# Patient Record
Sex: Male | Born: 1995 | Hispanic: No | Marital: Single | Smoking: Current every day smoker
Health system: Southern US, Community
[De-identification: ages and names within clinical notes are randomized; demographics above are authoritative.]

## PROBLEM LIST (undated history)

## (undated) HISTORY — PX: BACK SURGERY: SHX140

---

## 2015-06-12 ENCOUNTER — Encounter (HOSPITAL_COMMUNITY): Payer: Self-pay | Admitting: Emergency Medicine

## 2015-06-12 ENCOUNTER — Inpatient Hospital Stay (HOSPITAL_COMMUNITY)
Admission: EM | Admit: 2015-06-12 | Discharge: 2015-06-17 | DRG: 460 | Disposition: A | Discharge status: Home / Self Care | Payer: BLUE CROSS/BLUE SHIELD | Attending: General Surgery | Admitting: General Surgery

## 2015-06-12 ENCOUNTER — Emergency Department (HOSPITAL_COMMUNITY): Payer: BLUE CROSS/BLUE SHIELD

## 2015-06-12 DIAGNOSIS — Z419 Encounter for procedure for purposes other than remedying health state, unspecified: Secondary | ICD-10-CM

## 2015-06-12 DIAGNOSIS — S27322A Contusion of lung, bilateral, initial encounter: Secondary | ICD-10-CM | POA: Diagnosis present

## 2015-06-12 DIAGNOSIS — S32001A Stable burst fracture of unspecified lumbar vertebra, initial encounter for closed fracture: Secondary | ICD-10-CM | POA: Diagnosis present

## 2015-06-12 DIAGNOSIS — F17210 Nicotine dependence, cigarettes, uncomplicated: Secondary | ICD-10-CM | POA: Diagnosis present

## 2015-06-12 DIAGNOSIS — J939 Pneumothorax, unspecified: Secondary | ICD-10-CM

## 2015-06-12 DIAGNOSIS — W19XXXA Unspecified fall, initial encounter: Secondary | ICD-10-CM

## 2015-06-12 DIAGNOSIS — S32039A Unspecified fracture of third lumbar vertebra, initial encounter for closed fracture: Secondary | ICD-10-CM

## 2015-06-12 DIAGNOSIS — S32029A Unspecified fracture of second lumbar vertebra, initial encounter for closed fracture: Secondary | ICD-10-CM | POA: Diagnosis present

## 2015-06-12 DIAGNOSIS — G9611 Dural tear: Secondary | ICD-10-CM | POA: Diagnosis present

## 2015-06-12 DIAGNOSIS — S32059A Unspecified fracture of fifth lumbar vertebra, initial encounter for closed fracture: Secondary | ICD-10-CM | POA: Diagnosis present

## 2015-06-12 DIAGNOSIS — S2232XA Fracture of one rib, left side, initial encounter for closed fracture: Secondary | ICD-10-CM | POA: Diagnosis present

## 2015-06-12 DIAGNOSIS — S32031A Stable burst fracture of third lumbar vertebra, initial encounter for closed fracture: Secondary | ICD-10-CM | POA: Diagnosis not present

## 2015-06-12 DIAGNOSIS — S32049A Unspecified fracture of fourth lumbar vertebra, initial encounter for closed fracture: Secondary | ICD-10-CM | POA: Diagnosis present

## 2015-06-12 DIAGNOSIS — W14XXXA Fall from tree, initial encounter: Secondary | ICD-10-CM

## 2015-06-12 DIAGNOSIS — M549 Dorsalgia, unspecified: Secondary | ICD-10-CM | POA: Diagnosis not present

## 2015-06-12 LAB — CBC WITH DIFFERENTIAL/PLATELET
Basophils Absolute: 0 10*3/uL (ref 0.0–0.1)
Basophils Relative: 0 %
EOS ABS: 0.1 10*3/uL (ref 0.0–0.7)
EOS PCT: 1 %
HCT: 45 % (ref 39.0–52.0)
Hemoglobin: 16.3 g/dL (ref 13.0–17.0)
LYMPHS ABS: 2.6 10*3/uL (ref 0.7–4.0)
Lymphocytes Relative: 28 %
MCH: 32.8 pg (ref 26.0–34.0)
MCHC: 36.2 g/dL — AB (ref 30.0–36.0)
MCV: 90.5 fL (ref 78.0–100.0)
MONOS PCT: 12 %
Monocytes Absolute: 1.1 10*3/uL — ABNORMAL HIGH (ref 0.1–1.0)
Neutro Abs: 5.6 10*3/uL (ref 1.7–7.7)
Neutrophils Relative %: 60 %
PLATELETS: 240 10*3/uL (ref 150–400)
RBC: 4.97 MIL/uL (ref 4.22–5.81)
RDW: 13.1 % (ref 11.5–15.5)
WBC: 9.3 10*3/uL (ref 4.0–10.5)

## 2015-06-12 MED ORDER — MORPHINE SULFATE (PF) 4 MG/ML IV SOLN
4.0000 mg | Freq: Once | INTRAVENOUS | Status: AC
  Administered 2015-06-12: 4 mg via INTRAVENOUS
  Filled 2015-06-12: qty 1

## 2015-06-12 MED ORDER — SODIUM CHLORIDE 0.9 % IV BOLUS (SEPSIS)
1000.0000 mL | Freq: Once | INTRAVENOUS | Status: AC
  Administered 2015-06-12: 1000 mL via INTRAVENOUS

## 2015-06-12 NOTE — ED Provider Notes (Signed)
CSN: [id number]     Arrival date & time 06/12/15  2309 History  This chart was scribed for Tomasita Crumble, MD by Freida Busman, ED Scribe. This patient was seen in room B17C/B17C and the patient's care was started 11:12 PM.     Chief Complaint  Patient presents with  . Fall    The history is provided by the patient. No language interpreter was used.     HPI Comments:  Michael Lawrence is a 19 y.o. male who presents to the Emergency Department via EMS complaining of lower back pain following fall from a 10-12 foot tree this evening. He fell flat on his back; no LOC. Pt admits to having ETOH onboard. He arrives to the ED in C-collar and on backboard. No alleviating factors or associated symptoms noted.    History reviewed. No pertinent past medical history. History reviewed. No pertinent past surgical history. No family history on file. Social History  Substance Use Topics  . Smoking status: Current Every Day Smoker  . Smokeless tobacco: None  . Alcohol Use: Yes    Review of Systems  A complete 10 system review of systems was obtained and all systems are negative except as noted in the HPI and PMH.    Allergies  Review of patient's allergies indicates no known allergies.  Home Medications   Prior to Admission medications   Medication Sig Start Date End Date Taking? Authorizing Provider  ibuprofen (ADVIL,MOTRIN) 200 MG tablet Take 200 mg by mouth every 6 (six) hours as needed for moderate pain.   Yes Historical Provider, MD   BP 138/63 mmHg  Pulse 78  Temp(Src) 97.9 F (36.6 C) (Oral)  Resp 23  SpO2 96% Physical Exam  Constitutional: He is oriented to person, place, and time. Vital signs are normal. He appears well-developed and well-nourished.  Non-toxic appearance. He does not appear ill. No distress.  Clincally intoxicated  HENT:  Head: Normocephalic and atraumatic.  Nose: Nose normal.  Mouth/Throat: Oropharynx is clear and moist. No oropharyngeal exudate.  Eyes:  Conjunctivae and EOM are normal. Pupils are equal, round, and reactive to light. No scleral icterus.  Neck: No edema, no erythema and normal range of motion present. No thyroid mass present.  C-collar in place   Cardiovascular: Normal rate, regular rhythm, S1 normal, S2 normal, normal heart sounds, intact distal pulses and normal pulses.  Exam reveals no gallop and no friction rub.   No murmur heard. Pulses:      Radial pulses are 2+ on the right side, and 2+ on the left side.       Dorsalis pedis pulses are 2+ on the right side, and 2+ on the left side.  Pulmonary/Chest: Effort normal and breath sounds normal. No respiratory distress. He has no wheezes. He has no rhonchi. He has no rales.  Abdominal: Soft. Normal appearance and bowel sounds are normal. He exhibits no distension, no ascites and no mass. There is no hepatosplenomegaly. There is no tenderness. There is no rebound, no guarding and no CVA tenderness.  Musculoskeletal: Normal range of motion. He exhibits no edema or tenderness.  Neurological: He is alert and oriented to person, place, and time. He has normal strength. No cranial nerve deficit or sensory deficit.  Normal strength and sensation in all extremities  Follows all commands  Skin: Skin is warm, dry and intact. No petechiae and no rash noted. He is not diaphoretic. No erythema. No pallor.  Psychiatric: He has a normal mood and affect.  His behavior is normal. Judgment normal.  Nursing note and vitals reviewed.   ED Course  Procedures   DIAGNOSTIC STUDIES:  Oxygen Saturation is 96% on RA, normal by my interpretation.    COORDINATION OF CARE:  11:15 PM Will order imaging studies. Discussed treatment plan with pt at bedside and pt agreed to plan.  Labs Review Labs Reviewed  CBC WITH DIFFERENTIAL/PLATELET - Abnormal; Notable for the following:    MCHC 36.2 (*)    Monocytes Absolute 1.1 (*)    All other components within normal limits  COMPREHENSIVE METABOLIC PANEL  - Abnormal; Notable for the following:    Potassium 3.1 (*)    Chloride 100 (*)    Glucose, Bld 109 (*)    Total Bilirubin 1.5 (*)    All other components within normal limits  URINALYSIS, ROUTINE W REFLEX MICROSCOPIC (NOT AT [facility]) - Abnormal; Notable for the following:    Specific Gravity, Urine >1.046 (*)    Hgb urine dipstick TRACE (*)    Ketones, ur 15 (*)    Protein, ur 30 (*)    Leukocytes, UA SMALL (*)    All other components within normal limits  I-STAT CG4 LACTIC ACID, ED - Abnormal; Notable for the following:    Lactic Acid, Venous 2.22 (*)    All other components within normal limits  URINE MICROSCOPIC-ADD ON    Imaging Review Ct Head Wo Contrast  06/13/2015   CLINICAL DATA:  Low back pain after falling from 10-12 foot tree. No loss of consciousness. Alcohol on board.  EXAM: CT HEAD WITHOUT CONTRAST  CT CERVICAL SPINE WITHOUT CONTRAST  TECHNIQUE: Multidetector CT imaging of the head and cervical spine was performed following the standard protocol without intravenous contrast. Multiplanar CT image reconstructions of the cervical spine were also generated.  COMPARISON:  None.  FINDINGS: CT HEAD FINDINGS  Ventricles and sulci are symmetrical. No mass effect or midline shift. No abnormal extra-axial fluid collections. Gray-white matter junctions are distinct. Basal cisterns are not effaced. No evidence of acute intracranial hemorrhage. No depressed skull fractures. Visualized paranasal sinuses and mastoid air cells are not opacified.  CT CERVICAL SPINE FINDINGS  Normal alignment of the cervical spine. No vertebral compression deformities. Intervertebral disc space heights are preserved. No prevertebral soft tissue swelling. No focal bone lesion or bone destruction. C1-2 articulation appears intact. Bone cortex and trabecular architecture appear intact.  IMPRESSION: No acute intracranial abnormalities. Normal alignment of the cervical spine. No acute displaced fractures identified.    Electronically Signed   By: Burman Nieves M.D.   On: 06/13/2015 02:00   Ct Chest W Contrast  06/13/2015   CLINICAL DATA:  Low back pain after falling from a 10-12 foot tree this evening. No loss of consciousness. Alcohol on board.  EXAM: CT CHEST, ABDOMEN, AND PELVIS WITH CONTRAST  TECHNIQUE: Multidetector CT imaging of the chest, abdomen and pelvis was performed following the standard protocol during bolus administration of intravenous contrast.  CONTRAST:  OMNIPAQUE IOHEXOL 300 MG/ML  SOLN  COMPARISON:  None.  FINDINGS: CT CHEST FINDINGS  Normal heart size. Normal caliber thoracic aorta. No evidence of aortic aneurysm, allowing for significant motion artifact. Great vessel origins are patent. Esophagus is decompressed. No abnormal mediastinal gas or fluid collections. No significant lymphadenopathy in the chest.  Posterior infiltrates in the lungs likely representing pulmonary contusion. There is a tiny left anterior basilar pneumothorax. No pulmonary collapse or tension. Airways appear patent. No pleural effusions.  CT ABDOMEN PELVIS  FINDINGS  Small hyper enhancing focus in segment 5 of the liver measuring 1.3 cm diameter. This is likely represent a small focal nodular hyperplasia or perhaps a small transient hepatic attenuation defect. No focal liver lesions that would suggest laceration. The gallbladder, spleen, pancreas, adrenal glands, kidneys, abdominal aorta, inferior vena cava, and retroperitoneal lymph nodes are unremarkable. Stomach is filled with ingested fluid. No gastric distention. No small or large bowel distention. Small amount of gas in the left upper quadrant anterior abdominal wall. No free intra-abdominal air is identified. No free abdominal fluid. Abdominal wall musculature appears intact.  Pelvis: Prostate gland is not enlarged. Bladder wall is not thickened. No free or loculated pelvic fluid collections. No pelvic mass or lymphadenopathy. Appendix is not identified. No  diverticulitis.  Bones: Comminuted fracture of the L3 vertebra with retropulsion of a posterior fracture fragment into the central canal resulting in severe narrowing of the central canal anteriorly. At the narrowest point, the central canal measures only about 5 mm AP dimension. Displaced fracture fragments also cause compromise of the neural foramina bilaterally at L3-4. Fractures of the left transverse process of L2, L3, L4, and L5. Sacrum, pelvis, and hips appear intact. Normal alignment of the thoracic spine. No thoracic vertebral compression fractures identified. Visualized shoulders and clavicles appear intact. Fracture of the left posterior ninth rib without displacement. Sternum is intact.  IMPRESSION: Burst compression fracture of the L3 vertebra with retropulsion of posterior fracture fragments causing severe narrowing of the central canal and bilateral neural foramina. Fractures of the left transverse process of L2, L3, L4, and L5. Nondisplaced fracture of the left posterior ninth rib. Tiny left anterior pneumothorax without collapse or tension. Subcutaneous emphysema in the left anterior chest/abdominal wall. Bilateral posterior pulmonary contusions. No evidence of solid organ injury or bowel perforation. Small nonspecific hyper enhancing focus in segment 5 of the liver probably benign.  These results were called by telephone at the time of interpretation on 06/13/2015 at 2:11 am to Dr. Tomasita Crumble , who verbally acknowledged these results.   Electronically Signed   By: Burman Nieves M.D.   On: 06/13/2015 02:14   Ct Cervical Spine Wo Contrast  06/13/2015   CLINICAL DATA:  Low back pain after falling from 10-12 foot tree. No loss of consciousness. Alcohol on board.  EXAM: CT HEAD WITHOUT CONTRAST  CT CERVICAL SPINE WITHOUT CONTRAST  TECHNIQUE: Multidetector CT imaging of the head and cervical spine was performed following the standard protocol without intravenous contrast. Multiplanar CT image  reconstructions of the cervical spine were also generated.  COMPARISON:  None.  FINDINGS: CT HEAD FINDINGS  Ventricles and sulci are symmetrical. No mass effect or midline shift. No abnormal extra-axial fluid collections. Gray-white matter junctions are distinct. Basal cisterns are not effaced. No evidence of acute intracranial hemorrhage. No depressed skull fractures. Visualized paranasal sinuses and mastoid air cells are not opacified.  CT CERVICAL SPINE FINDINGS  Normal alignment of the cervical spine. No vertebral compression deformities. Intervertebral disc space heights are preserved. No prevertebral soft tissue swelling. No focal bone lesion or bone destruction. C1-2 articulation appears intact. Bone cortex and trabecular architecture appear intact.  IMPRESSION: No acute intracranial abnormalities. Normal alignment of the cervical spine. No acute displaced fractures identified.   Electronically Signed   By: Burman Nieves M.D.   On: 06/13/2015 02:00   Ct Abdomen Pelvis W Contrast  06/13/2015   CLINICAL DATA:  Low back pain after falling from a 10-12 foot tree this evening.  No loss of consciousness. Alcohol on board.  EXAM: CT CHEST, ABDOMEN, AND PELVIS WITH CONTRAST  TECHNIQUE: Multidetector CT imaging of the chest, abdomen and pelvis was performed following the standard protocol during bolus administration of intravenous contrast.  CONTRAST:  OMNIPAQUE IOHEXOL 300 MG/ML  SOLN  COMPARISON:  None.  FINDINGS: CT CHEST FINDINGS  Normal heart size. Normal caliber thoracic aorta. No evidence of aortic aneurysm, allowing for significant motion artifact. Great vessel origins are patent. Esophagus is decompressed. No abnormal mediastinal gas or fluid collections. No significant lymphadenopathy in the chest.  Posterior infiltrates in the lungs likely representing pulmonary contusion. There is a tiny left anterior basilar pneumothorax. No pulmonary collapse or tension. Airways appear patent. No pleural  effusions.  CT ABDOMEN PELVIS FINDINGS  Small hyper enhancing focus in segment 5 of the liver measuring 1.3 cm diameter. This is likely represent a small focal nodular hyperplasia or perhaps a small transient hepatic attenuation defect. No focal liver lesions that would suggest laceration. The gallbladder, spleen, pancreas, adrenal glands, kidneys, abdominal aorta, inferior vena cava, and retroperitoneal lymph nodes are unremarkable. Stomach is filled with ingested fluid. No gastric distention. No small or large bowel distention. Small amount of gas in the left upper quadrant anterior abdominal wall. No free intra-abdominal air is identified. No free abdominal fluid. Abdominal wall musculature appears intact.  Pelvis: Prostate gland is not enlarged. Bladder wall is not thickened. No free or loculated pelvic fluid collections. No pelvic mass or lymphadenopathy. Appendix is not identified. No diverticulitis.  Bones: Comminuted fracture of the L3 vertebra with retropulsion of a posterior fracture fragment into the central canal resulting in severe narrowing of the central canal anteriorly. At the narrowest point, the central canal measures only about 5 mm AP dimension. Displaced fracture fragments also cause compromise of the neural foramina bilaterally at L3-4. Fractures of the left transverse process of L2, L3, L4, and L5. Sacrum, pelvis, and hips appear intact. Normal alignment of the thoracic spine. No thoracic vertebral compression fractures identified. Visualized shoulders and clavicles appear intact. Fracture of the left posterior ninth rib without displacement. Sternum is intact.  IMPRESSION: Burst compression fracture of the L3 vertebra with retropulsion of posterior fracture fragments causing severe narrowing of the central canal and bilateral neural foramina. Fractures of the left transverse process of L2, L3, L4, and L5. Nondisplaced fracture of the left posterior ninth rib. Tiny left anterior pneumothorax  without collapse or tension. Subcutaneous emphysema in the left anterior chest/abdominal wall. Bilateral posterior pulmonary contusions. No evidence of solid organ injury or bowel perforation. Small nonspecific hyper enhancing focus in segment 5 of the liver probably benign.  These results were called by telephone at the time of interpretation on 06/13/2015 at 2:11 am to Dr. Tomasita Crumble , who verbally acknowledged these results.   Electronically Signed   By: Burman Nieves M.D.   On: 06/13/2015 02:14   Dg Pelvis Portable  06/13/2015   CLINICAL DATA:  Back pain after fall from tree at 10-12 feet.  EXAM: PORTABLE PELVIS 1-2 VIEWS  COMPARISON:  None.  FINDINGS: Mild widening of the symphysis pubis. Can't exclude pubic diastases versus normal variation. Internal pelvic line appears intact. SI joints are not displaced. No acute fractures demonstrated in the pelvis or visualized hips.  IMPRESSION: Mild widening of the symphysis pubis. Can't exclude mild pubic diastases although this could be normal variation. No acute fractures demonstrated.   Electronically Signed   By: Burman Nieves M.D.   On:  06/13/2015 00:04   Ct T-spine No Charge  06/13/2015   CLINICAL DATA:  Low back pain after a fall from 10-12 foot tree this evening. No loss of consciousness. Alcohol.  EXAM: CT THORACIC SPINE WITHOUT CONTRAST  TECHNIQUE: Multidetector CT imaging of the thoracic spine was performed without intravenous contrast administration. Multiplanar CT image reconstructions were also generated.  COMPARISON:  None.  FINDINGS: Normal alignment of the thoracic spine. No vertebral compression deformities. Intervertebral disc space heights are preserved. No focal bone lesion or bone destruction. Bone cortex and trabecular architecture appears intact. Incidental note of a nondisplaced fracture of the left posterior ninth rib. No paraspinal soft tissue swelling or infiltration.  IMPRESSION: Normal alignment of the thoracic spine. No acute  displaced fractures identified. Nondisplaced fracture of the left posterior ninth rib.   Electronically Signed   By: Burman Nieves M.D.   On: 06/13/2015 01:56   Ct L-spine No Charge  06/13/2015   CLINICAL DATA:  Low back pain after a fall 10-12 feet from a tree. Alcohol on board.  EXAM: CT LUMBAR SPINE WITHOUT CONTRAST  TECHNIQUE: Multidetector CT imaging of the lumbar spine was performed without intravenous contrast administration. Multiplanar CT image reconstructions were also generated.  COMPARISON:  None.  FINDINGS: There is a burst compression fracture of the L3 vertebra with about 20% loss of height diffusely. There is retropulsion of comminuted fracture fragments causing severe narrowing of the central canal, which measures only about 5 mm in AP dimension. Fracture fragments extend to cause significant effacement of the left lateral recess and of the neural foramina bilaterally. No anterior subluxation. Remaining lumbar vertebrae demonstrate normal alignment. No additional compression deformities are demonstrated. Fractures of the left transverse process of L2, L3, L4, and L5. Visualize sacrum appears intact.  IMPRESSION: Burst compression fracture the L3 vertebra with retropulsion of fracture fragments causing significant effacement of the central canal and bilateral neural foramina. Fractures of the left transverse process of L2, L3, L4, and L5.   Electronically Signed   By: Burman Nieves M.D.   On: 06/13/2015 02:19   I have personally reviewed and evaluated these images and lab results as part of my medical decision-making.   EKG Interpretation None      MDM   Final diagnoses:  Fall    Patient presents emergency department after heavy drinking and climbing a tree. He fell out of a tree 12 feet onto the ground. He is currently complaining of lower back pain.  He denies any neurological symptoms. Will obtain CT scan from head to abdomen including spine due to mechanism of injury. He  was given morphine for pain control. IVF ordered as well.  I spoke with Dr. Wynetta Emery with neurosurgery who recommends for the patient to be admitted for his L3 injury. Because he has multiple systems injured, including left posterior ninth rib fracture, small left pneumothorax, pulmonary contusions, patient will be admitted to, surgery. I have paged them for admission.  I personally performed the services described in this documentation, which was scribed in my presence. The recorded information has been reviewed and is accurate.   Tomasita Crumble, MD 06/13/15 [phone]

## 2015-06-12 NOTE — ED Notes (Signed)
Per ems-- pt has been drinking tonight and decided to climb in a tree. Pt fell from tree at 10-12 feet. Pt landed on back. No loc. Pt on lsb and c-collar in place. Pt alert and oriented x4.

## 2015-06-13 ENCOUNTER — Inpatient Hospital Stay (HOSPITAL_COMMUNITY): Payer: BLUE CROSS/BLUE SHIELD

## 2015-06-13 ENCOUNTER — Inpatient Hospital Stay (HOSPITAL_COMMUNITY): Payer: BLUE CROSS/BLUE SHIELD | Admitting: Anesthesiology

## 2015-06-13 ENCOUNTER — Encounter (HOSPITAL_COMMUNITY): Payer: Self-pay | Admitting: Radiology

## 2015-06-13 ENCOUNTER — Emergency Department (HOSPITAL_COMMUNITY): Payer: BLUE CROSS/BLUE SHIELD

## 2015-06-13 ENCOUNTER — Encounter (HOSPITAL_COMMUNITY): Admission: EM | Disposition: A | Discharge status: Home / Self Care | Payer: Self-pay | Source: Home / Self Care

## 2015-06-13 DIAGNOSIS — S32029A Unspecified fracture of second lumbar vertebra, initial encounter for closed fracture: Secondary | ICD-10-CM | POA: Diagnosis present

## 2015-06-13 DIAGNOSIS — G9611 Dural tear: Secondary | ICD-10-CM | POA: Diagnosis present

## 2015-06-13 DIAGNOSIS — S32031A Stable burst fracture of third lumbar vertebra, initial encounter for closed fracture: Secondary | ICD-10-CM | POA: Diagnosis present

## 2015-06-13 DIAGNOSIS — F17210 Nicotine dependence, cigarettes, uncomplicated: Secondary | ICD-10-CM | POA: Diagnosis present

## 2015-06-13 DIAGNOSIS — W14XXXA Fall from tree, initial encounter: Secondary | ICD-10-CM | POA: Diagnosis not present

## 2015-06-13 DIAGNOSIS — M549 Dorsalgia, unspecified: Secondary | ICD-10-CM | POA: Diagnosis present

## 2015-06-13 DIAGNOSIS — S32001A Stable burst fracture of unspecified lumbar vertebra, initial encounter for closed fracture: Secondary | ICD-10-CM | POA: Diagnosis present

## 2015-06-13 DIAGNOSIS — S27322A Contusion of lung, bilateral, initial encounter: Secondary | ICD-10-CM | POA: Diagnosis present

## 2015-06-13 DIAGNOSIS — S32059A Unspecified fracture of fifth lumbar vertebra, initial encounter for closed fracture: Secondary | ICD-10-CM | POA: Diagnosis present

## 2015-06-13 DIAGNOSIS — S2232XA Fracture of one rib, left side, initial encounter for closed fracture: Secondary | ICD-10-CM | POA: Diagnosis present

## 2015-06-13 DIAGNOSIS — S32049A Unspecified fracture of fourth lumbar vertebra, initial encounter for closed fracture: Secondary | ICD-10-CM | POA: Diagnosis present

## 2015-06-13 HISTORY — PX: POSTERIOR LUMBAR FUSION: SHX6036

## 2015-06-13 LAB — BASIC METABOLIC PANEL
Anion gap: 10 (ref 5–15)
BUN: 9 mg/dL (ref 6–20)
CALCIUM: 9.2 mg/dL (ref 8.9–10.3)
CHLORIDE: 103 mmol/L (ref 101–111)
CO2: 24 mmol/L (ref 22–32)
CREATININE: 0.89 mg/dL (ref 0.61–1.24)
GFR calc non Af Amer: >60 mL/min (ref >60)
GLUCOSE: 113 mg/dL — AB (ref 65–99)
Potassium: 3.9 mmol/L (ref 3.5–5.1)
Sodium: 137 mmol/L (ref 135–145)

## 2015-06-13 LAB — I-STAT CG4 LACTIC ACID, ED: LACTIC ACID, VENOUS: 2.22 mmol/L — AB (ref 0.5–2.0)

## 2015-06-13 LAB — CBC
HEMATOCRIT: 44.9 % (ref 39.0–52.0)
HEMOGLOBIN: 15.7 g/dL (ref 13.0–17.0)
MCH: 31.7 pg (ref 26.0–34.0)
MCHC: 35 g/dL (ref 30.0–36.0)
MCV: 90.5 fL (ref 78.0–100.0)
Platelets: 236 10*3/uL (ref 150–400)
RBC: 4.96 MIL/uL (ref 4.22–5.81)
RDW: 13.1 % (ref 11.5–15.5)
WBC: 15.6 10*3/uL — ABNORMAL HIGH (ref 4.0–10.5)

## 2015-06-13 LAB — URINALYSIS, ROUTINE W REFLEX MICROSCOPIC
Bilirubin Urine: NEGATIVE
GLUCOSE, UA: NEGATIVE mg/dL
Ketones, ur: 15 mg/dL — AB
Nitrite: NEGATIVE
PROTEIN: 30 mg/dL — AB
UROBILINOGEN UA: 0.2 mg/dL (ref 0.0–1.0)
pH: 6 (ref 5.0–8.0)

## 2015-06-13 LAB — COMPREHENSIVE METABOLIC PANEL
ALT: 25 U/L (ref 17–63)
AST: 37 U/L (ref 15–41)
Albumin: 4.2 g/dL (ref 3.5–5.0)
Alkaline Phosphatase: 76 U/L (ref 38–126)
Anion gap: 12 (ref 5–15)
BUN: 10 mg/dL (ref 6–20)
CHLORIDE: 100 mmol/L — AB (ref 101–111)
CO2: 25 mmol/L (ref 22–32)
CREATININE: 1.03 mg/dL (ref 0.61–1.24)
Calcium: 9.4 mg/dL (ref 8.9–10.3)
GFR calc Af Amer: >60 mL/min (ref >60)
GFR calc non Af Amer: >60 mL/min (ref >60)
Glucose, Bld: 109 mg/dL — ABNORMAL HIGH (ref 65–99)
POTASSIUM: 3.1 mmol/L — AB (ref 3.5–5.1)
SODIUM: 137 mmol/L (ref 135–145)
Total Bilirubin: 1.5 mg/dL — ABNORMAL HIGH (ref 0.3–1.2)
Total Protein: 7.2 g/dL (ref 6.5–8.1)

## 2015-06-13 LAB — MRSA PCR SCREENING: MRSA by PCR: NEGATIVE

## 2015-06-13 LAB — URINE MICROSCOPIC-ADD ON

## 2015-06-13 LAB — TYPE AND SCREEN
ABO/RH(D): O POS
ANTIBODY SCREEN: NEGATIVE

## 2015-06-13 LAB — ABO/RH: ABO/RH(D): O POS

## 2015-06-13 SURGERY — POSTERIOR LUMBAR FUSION
Anesthesia: General | Site: Spine Lumbar

## 2015-06-13 MED ORDER — 0.9 % SODIUM CHLORIDE (POUR BTL) OPTIME
TOPICAL | Status: DC | PRN
  Administered 2015-06-13: 1000 mL

## 2015-06-13 MED ORDER — SUCCINYLCHOLINE CHLORIDE 20 MG/ML IJ SOLN
INTRAMUSCULAR | Status: AC
  Filled 2015-06-13: qty 1

## 2015-06-13 MED ORDER — THROMBIN 20000 UNITS EX SOLR
CUTANEOUS | Status: DC | PRN
  Administered 2015-06-13: 12:00:00 via TOPICAL

## 2015-06-13 MED ORDER — MIDAZOLAM HCL 2 MG/2ML IJ SOLN
INTRAMUSCULAR | Status: AC
  Filled 2015-06-13: qty 4

## 2015-06-13 MED ORDER — CEFAZOLIN SODIUM-DEXTROSE 2-3 GM-% IV SOLR
INTRAVENOUS | Status: DC | PRN
  Administered 2015-06-13: 2 g via INTRAVENOUS

## 2015-06-13 MED ORDER — OXYCODONE-ACETAMINOPHEN 5-325 MG PO TABS
1.0000 | ORAL_TABLET | ORAL | Status: DC | PRN
  Administered 2015-06-14 (×2): 2 via ORAL
  Filled 2015-06-13 (×2): qty 2

## 2015-06-13 MED ORDER — CEFAZOLIN SODIUM-DEXTROSE 2-3 GM-% IV SOLR: INTRAVENOUS | Status: DC | PRN

## 2015-06-13 MED ORDER — VANCOMYCIN HCL 1000 MG IV SOLR
INTRAVENOUS | Status: AC
  Filled 2015-06-13: qty 1000

## 2015-06-13 MED ORDER — VANCOMYCIN HCL 1000 MG IV SOLR
INTRAVENOUS | Status: DC | PRN
  Administered 2015-06-13: 1000 mg via TOPICAL

## 2015-06-13 MED ORDER — LACTATED RINGERS IV SOLN
INTRAVENOUS | Status: DC | PRN
  Administered 2015-06-13 (×3): via INTRAVENOUS

## 2015-06-13 MED ORDER — CEFAZOLIN SODIUM-DEXTROSE 2-3 GM-% IV SOLR
2.0000 g | Freq: Three times a day (TID) | INTRAVENOUS | Status: AC
  Administered 2015-06-13 – 2015-06-14 (×2): 2 g via INTRAVENOUS
  Filled 2015-06-13 (×2): qty 50

## 2015-06-13 MED ORDER — CYCLOBENZAPRINE HCL 10 MG PO TABS
10.0000 mg | ORAL_TABLET | Freq: Three times a day (TID) | ORAL | Status: DC | PRN
Start: 2015-06-13 — End: 2015-06-17
  Administered 2015-06-13 – 2015-06-16 (×4): 10 mg via ORAL
  Filled 2015-06-13 (×4): qty 1

## 2015-06-13 MED ORDER — ACETAMINOPHEN 325 MG PO TABS: 650.0000 mg | ORAL_TABLET | ORAL | Status: DC | PRN

## 2015-06-13 MED ORDER — GLYCOPYRROLATE 0.2 MG/ML IJ SOLN
INTRAMUSCULAR | Status: DC | PRN
  Administered 2015-06-13: .8 mg via INTRAVENOUS

## 2015-06-13 MED ORDER — PROPOFOL 10 MG/ML IV BOLUS
INTRAVENOUS | Status: DC | PRN
  Administered 2015-06-13: 180 mg via INTRAVENOUS

## 2015-06-13 MED ORDER — FENTANYL CITRATE (PF) 100 MCG/2ML IJ SOLN
INTRAMUSCULAR | Status: DC | PRN
  Administered 2015-06-13: 25 ug via INTRAVENOUS
  Administered 2015-06-13: 100 ug via INTRAVENOUS
  Administered 2015-06-13 (×4): 50 ug via INTRAVENOUS
  Administered 2015-06-13: 150 ug via INTRAVENOUS
  Administered 2015-06-13: 25 ug via INTRAVENOUS

## 2015-06-13 MED ORDER — ALBUMIN HUMAN 5 % IV SOLN
INTRAVENOUS | Status: DC | PRN
  Administered 2015-06-13: 14:00:00 via INTRAVENOUS

## 2015-06-13 MED ORDER — SODIUM CHLORIDE 0.9 % IJ SOLN
3.0000 mL | Freq: Two times a day (BID) | INTRAMUSCULAR | Status: DC
  Administered 2015-06-14 – 2015-06-17 (×7): 3 mL via INTRAVENOUS

## 2015-06-13 MED ORDER — SODIUM CHLORIDE 0.9 % IR SOLN
Status: DC | PRN
  Administered 2015-06-13: 12:00:00

## 2015-06-13 MED ORDER — SODIUM CHLORIDE 0.9 % IV SOLN: 250.0000 mL | INTRAVENOUS | Status: DC

## 2015-06-13 MED ORDER — PHENOL 1.4 % MT LIQD
1.0000 | OROMUCOSAL | Status: DC | PRN
Start: 2015-06-13 — End: 2015-06-17

## 2015-06-13 MED ORDER — SUCCINYLCHOLINE CHLORIDE 20 MG/ML IJ SOLN
INTRAMUSCULAR | Status: DC | PRN
  Administered 2015-06-13: 120 mg via INTRAVENOUS

## 2015-06-13 MED ORDER — LIDOCAINE HCL (CARDIAC) 20 MG/ML IV SOLN
INTRAVENOUS | Status: AC
  Filled 2015-06-13: qty 5

## 2015-06-13 MED ORDER — MENTHOL 3 MG MT LOZG
1.0000 | LOZENGE | OROMUCOSAL | Status: DC | PRN
  Filled 2015-06-13: qty 9

## 2015-06-13 MED ORDER — HYDROMORPHONE HCL 1 MG/ML IJ SOLN
0.2500 mg | INTRAMUSCULAR | Status: DC | PRN
Start: 2015-06-13 — End: 2015-06-13
  Administered 2015-06-13 (×2): 0.5 mg via INTRAVENOUS

## 2015-06-13 MED ORDER — ONDANSETRON HCL 4 MG/2ML IJ SOLN: 4.0000 mg | Freq: Four times a day (QID) | INTRAMUSCULAR | Status: DC | PRN

## 2015-06-13 MED ORDER — LIDOCAINE-EPINEPHRINE 1 %-1:100000 IJ SOLN
INTRAMUSCULAR | Status: DC | PRN
  Administered 2015-06-13: 9 mL

## 2015-06-13 MED ORDER — HYDROMORPHONE HCL 1 MG/ML IJ SOLN
1.0000 mg | Freq: Once | INTRAMUSCULAR | Status: AC
  Administered 2015-06-13: 1 mg via INTRAVENOUS
  Filled 2015-06-13: qty 1

## 2015-06-13 MED ORDER — ONDANSETRON HCL 4 MG PO TABS: 4.0000 mg | ORAL_TABLET | Freq: Four times a day (QID) | ORAL | Status: DC | PRN

## 2015-06-13 MED ORDER — HYDROMORPHONE HCL 1 MG/ML IJ SOLN
1.0000 mg | INTRAMUSCULAR | Status: DC | PRN
  Administered 2015-06-13 – 2015-06-15 (×21): 1 mg via INTRAVENOUS
  Filled 2015-06-13 (×21): qty 1

## 2015-06-13 MED ORDER — BUPIVACAINE LIPOSOME 1.3 % IJ SUSP
20.0000 mL | Freq: Once | INTRAMUSCULAR | Status: DC
  Filled 2015-06-13: qty 20

## 2015-06-13 MED ORDER — PROPOFOL 10 MG/ML IV BOLUS
INTRAVENOUS | Status: AC
Start: 2015-06-13 — End: 2015-06-13
  Filled 2015-06-13: qty 20

## 2015-06-13 MED ORDER — ONDANSETRON HCL 4 MG/2ML IJ SOLN
4.0000 mg | INTRAMUSCULAR | Status: DC | PRN
  Administered 2015-06-16: 4 mg via INTRAVENOUS
  Filled 2015-06-13: qty 2

## 2015-06-13 MED ORDER — PROPOFOL 10 MG/ML IV BOLUS
INTRAVENOUS | Status: AC
  Filled 2015-06-13: qty 20

## 2015-06-13 MED ORDER — PHENYLEPHRINE HCL 10 MG/ML IJ SOLN
INTRAMUSCULAR | Status: DC | PRN
  Administered 2015-06-13: 40 ug via INTRAVENOUS
  Administered 2015-06-13 (×2): 80 ug via INTRAVENOUS
  Administered 2015-06-13: 40 ug via INTRAVENOUS

## 2015-06-13 MED ORDER — ACETAMINOPHEN 650 MG RE SUPP: 650.0000 mg | RECTAL | Status: DC | PRN

## 2015-06-13 MED ORDER — ROCURONIUM BROMIDE 50 MG/5ML IV SOLN
INTRAVENOUS | Status: AC
  Filled 2015-06-13: qty 1

## 2015-06-13 MED ORDER — NEOSTIGMINE METHYLSULFATE 10 MG/10ML IV SOLN
INTRAVENOUS | Status: DC | PRN
  Administered 2015-06-13: 5 mg via INTRAVENOUS

## 2015-06-13 MED ORDER — FENTANYL CITRATE (PF) 250 MCG/5ML IJ SOLN
INTRAMUSCULAR | Status: AC
  Filled 2015-06-13: qty 5

## 2015-06-13 MED ORDER — ONDANSETRON HCL 4 MG/2ML IJ SOLN
4.0000 mg | Freq: Four times a day (QID) | INTRAMUSCULAR | Status: DC | PRN
  Administered 2015-06-13: 4 mg via INTRAVENOUS

## 2015-06-13 MED ORDER — PHENYLEPHRINE 40 MCG/ML (10ML) SYRINGE FOR IV PUSH (FOR BLOOD PRESSURE SUPPORT)
PREFILLED_SYRINGE | INTRAVENOUS | Status: AC
  Filled 2015-06-13: qty 10

## 2015-06-13 MED ORDER — DEXTROSE-NACL 5-0.9 % IV SOLN
INTRAVENOUS | Status: DC
  Administered 2015-06-13: 16:00:00 via INTRAVENOUS
  Administered 2015-06-13: 1000 mL via INTRAVENOUS
  Administered 2015-06-14: via INTRAVENOUS
  Administered 2015-06-15: 50 mL/h via INTRAVENOUS

## 2015-06-13 MED ORDER — OXYCODONE HCL 5 MG PO TABS: 5.0000 mg | ORAL_TABLET | Freq: Once | ORAL | Status: DC | PRN

## 2015-06-13 MED ORDER — LIDOCAINE HCL (CARDIAC) 20 MG/ML IV SOLN
INTRAVENOUS | Status: DC | PRN
  Administered 2015-06-13: 80 mg via INTRAVENOUS

## 2015-06-13 MED ORDER — ROCURONIUM BROMIDE 100 MG/10ML IV SOLN
INTRAVENOUS | Status: DC | PRN
  Administered 2015-06-13 (×3): 10 mg via INTRAVENOUS
  Administered 2015-06-13: 20 mg via INTRAVENOUS
  Administered 2015-06-13: 30 mg via INTRAVENOUS
  Administered 2015-06-13: 20 mg via INTRAVENOUS
  Administered 2015-06-13: 10 mg via INTRAVENOUS

## 2015-06-13 MED ORDER — MIDAZOLAM HCL 5 MG/5ML IJ SOLN
INTRAMUSCULAR | Status: DC | PRN
  Administered 2015-06-13: 2 mg via INTRAVENOUS

## 2015-06-13 MED ORDER — SODIUM CHLORIDE 0.9 % IJ SOLN: 3.0000 mL | INTRAMUSCULAR | Status: DC | PRN

## 2015-06-13 MED ORDER — IOHEXOL 300 MG/ML  SOLN
100.0000 mL | Freq: Once | INTRAMUSCULAR | Status: AC | PRN
  Administered 2015-06-13: 100 mL via INTRAVENOUS

## 2015-06-13 MED ORDER — HYDROMORPHONE HCL 1 MG/ML IJ SOLN
0.5000 mg | INTRAMUSCULAR | Status: DC | PRN
  Filled 2015-06-13: qty 1

## 2015-06-13 MED ORDER — THROMBIN 5000 UNITS EX SOLR
CUTANEOUS | Status: DC | PRN
  Administered 2015-06-13: 12:00:00 via TOPICAL

## 2015-06-13 MED ORDER — OXYCODONE HCL 5 MG/5ML PO SOLN: 5.0000 mg | Freq: Once | ORAL | Status: DC | PRN

## 2015-06-13 MED ORDER — HYDROMORPHONE HCL 1 MG/ML IJ SOLN
INTRAMUSCULAR | Status: AC
  Filled 2015-06-13: qty 1

## 2015-06-13 MED ORDER — BUPIVACAINE LIPOSOME 1.3 % IJ SUSP
INTRAMUSCULAR | Status: DC | PRN
  Administered 2015-06-13: 20 mL

## 2015-06-13 SURGICAL SUPPLY — 69 items
6.5 X 45 screw (Screw) ×12 IMPLANT
85mm straight rod (Rod) ×6 IMPLANT
ALLOSTEM STRIP 20MMX50MM (Tissue) ×3 IMPLANT
BAG DECANTER FOR FLEXI CONT (MISCELLANEOUS) ×3 IMPLANT
BENZOIN TINCTURE PRP APPL 2/3 (GAUZE/BANDAGES/DRESSINGS) ×3 IMPLANT
BLADE CLIPPER SURG (BLADE) IMPLANT
BLADE SURG 11 STRL SS (BLADE) ×3 IMPLANT
BONE ALLOSTEM MORSELIZED 5CC (Bone Implant) ×6 IMPLANT
BRUSH SCRUB EZ PLAIN DRY (MISCELLANEOUS) ×6 IMPLANT
BUR MATCHSTICK NEURO 3.0 LAGG (BURR) ×6 IMPLANT
BUR PRECISION FLUTE 6.0 (BURR) ×3 IMPLANT
CANISTER SUCT 3000ML PPV (MISCELLANEOUS) ×3 IMPLANT
CAP LOCKING THREADED (Cap) ×12 IMPLANT
CLOSURE WOUND 1/2 X4 (GAUZE/BANDAGES/DRESSINGS) ×1
CONT SPEC 4OZ CLIKSEAL STRL BL (MISCELLANEOUS) ×3 IMPLANT
COVER BACK TABLE 60X90IN (DRAPES) ×3 IMPLANT
CROSSLINK SPINAL FUSION (Cage) ×3 IMPLANT
DECANTER SPIKE VIAL GLASS SM (MISCELLANEOUS) IMPLANT
DERMABOND ADVANCED (GAUZE/BANDAGES/DRESSINGS) ×2
DERMABOND ADVANCED .7 DNX12 (GAUZE/BANDAGES/DRESSINGS) ×1 IMPLANT
DRAPE C-ARM 42X72 X-RAY (DRAPES) ×6 IMPLANT
DRAPE C-ARMOR (DRAPES) IMPLANT
DRAPE LAPAROTOMY 100X72X124 (DRAPES) ×3 IMPLANT
DRAPE POUCH INSTRU U-SHP 10X18 (DRAPES) ×3 IMPLANT
DRAPE PROXIMA HALF (DRAPES) IMPLANT
DRAPE SURG 17X23 STRL (DRAPES) ×3 IMPLANT
DURAPREP 26ML APPLICATOR (WOUND CARE) ×3 IMPLANT
DURASEAL APPLICATOR TIP (TIP) ×3 IMPLANT
DURASEAL SPINE SEALANT 3ML (MISCELLANEOUS) ×3 IMPLANT
ELECT REM PT RETURN 9FT ADLT (ELECTROSURGICAL) ×3
ELECTRODE REM PT RTRN 9FT ADLT (ELECTROSURGICAL) ×1 IMPLANT
EVACUATOR 3/16  PVC DRAIN (DRAIN)
EVACUATOR 3/16 PVC DRAIN (DRAIN) IMPLANT
GAUZE SPONGE 4X4 12PLY STRL (GAUZE/BANDAGES/DRESSINGS) ×3 IMPLANT
GAUZE SPONGE 4X4 16PLY XRAY LF (GAUZE/BANDAGES/DRESSINGS) ×3 IMPLANT
GLOVE BIO SURGEON STRL SZ8 (GLOVE) ×6 IMPLANT
GLOVE EXAM NITRILE LRG STRL (GLOVE) IMPLANT
GLOVE EXAM NITRILE MD LF STRL (GLOVE) IMPLANT
GLOVE EXAM NITRILE XL STR (GLOVE) IMPLANT
GLOVE EXAM NITRILE XS STR PU (GLOVE) IMPLANT
GLOVE INDICATOR 8.5 STRL (GLOVE) ×6 IMPLANT
GOWN STRL REUS W/ TWL LRG LVL3 (GOWN DISPOSABLE) IMPLANT
GOWN STRL REUS W/ TWL XL LVL3 (GOWN DISPOSABLE) ×5 IMPLANT
GOWN STRL REUS W/TWL 2XL LVL3 (GOWN DISPOSABLE) IMPLANT
GOWN STRL REUS W/TWL LRG LVL3 (GOWN DISPOSABLE)
GOWN STRL REUS W/TWL XL LVL3 (GOWN DISPOSABLE) ×10
HEMOSTAT POWDER KIT SURGIFOAM (HEMOSTASIS) ×3 IMPLANT
KIT BASIN OR (CUSTOM PROCEDURE TRAY) ×3 IMPLANT
KIT ROOM TURNOVER OR (KITS) ×3 IMPLANT
LIQUID BAND (GAUZE/BANDAGES/DRESSINGS) IMPLANT
MILL MEDIUM DISP (BLADE) ×3 IMPLANT
NEEDLE HYPO 21X1.5 SAFETY (NEEDLE) ×3 IMPLANT
NEEDLE HYPO 25X1 1.5 SAFETY (NEEDLE) ×3 IMPLANT
NS IRRIG 1000ML POUR BTL (IV SOLUTION) ×3 IMPLANT
PACK LAMINECTOMY NEURO (CUSTOM PROCEDURE TRAY) ×3 IMPLANT
PAD ARMBOARD 7.5X6 YLW CONV (MISCELLANEOUS) ×9 IMPLANT
SCREW PA THRD CREO TULIP 5.5X4 (Head) ×12 IMPLANT
SPONGE LAP 4X18 X RAY DECT (DISPOSABLE) IMPLANT
SPONGE SURGIFOAM ABS GEL 100 (HEMOSTASIS) ×3 IMPLANT
STRIP CLOSURE SKIN 1/2X4 (GAUZE/BANDAGES/DRESSINGS) ×2 IMPLANT
SUT VIC AB 0 CT1 18XCR BRD8 (SUTURE) ×1 IMPLANT
SUT VIC AB 0 CT1 8-18 (SUTURE) ×2
SUT VIC AB 2-0 CT1 18 (SUTURE) ×3 IMPLANT
SUT VICRYL 4-0 PS2 18IN ABS (SUTURE) ×3 IMPLANT
SYR 20CC LL (SYRINGE) ×3 IMPLANT
TOWEL OR 17X24 6PK STRL BLUE (TOWEL DISPOSABLE) ×3 IMPLANT
TOWEL OR 17X26 10 PK STRL BLUE (TOWEL DISPOSABLE) ×3 IMPLANT
TRAY FOLEY W/METER SILVER 14FR (SET/KITS/TRAYS/PACK) ×3 IMPLANT
WATER STERILE IRR 1000ML POUR (IV SOLUTION) ×3 IMPLANT

## 2015-06-13 NOTE — Anesthesia Preprocedure Evaluation (Addendum)
Anesthesia Evaluation  Patient identified by MRN, date of birth, ID band Patient awake    Reviewed: Allergy & Precautions, NPO status , Patient's Chart, lab work & pertinent test results  Airway Mallampati: II   Neck ROM: full    Dental   Pulmonary Current Smoker,    breath sounds clear to auscultation       Cardiovascular negative cardio ROS   Rhythm:regular Rate:Normal     Neuro/Psych negative neurological ROS  negative psych ROS   GI/Hepatic negative GI ROS, Neg liver ROS,   Endo/Other  negative endocrine ROS  Renal/GU negative Renal ROS     Musculoskeletal negative musculoskeletal ROS (+)   Abdominal   Peds  Hematology negative hematology ROS (+)   Anesthesia Other Findings   Reproductive/Obstetrics negative OB ROS                            Anesthesia Physical Anesthesia Plan  ASA: II  Anesthesia Plan: General   Post-op Pain Management:    Induction: Intravenous  Airway Management Planned: Oral ETT  Additional Equipment:   Intra-op Plan:   Post-operative Plan: Extubation in OR  Informed Consent: I have reviewed the patients History and Physical, chart, labs and discussed the procedure including the risks, benefits and alternatives for the proposed anesthesia with the patient or authorized representative who has indicated his/her understanding and acceptance.     Plan Discussed with: CRNA, Anesthesiologist and Surgeon  Anesthesia Plan Comments:         Anesthesia Quick Evaluation

## 2015-06-13 NOTE — H&P (Signed)
History   Michael Lawrence is an 19 y.o. male.   Chief Complaint:  Chief Complaint  Patient presents with  . Fall    Fall This is a new problem. The current episode started today.   The patient is a 19 year old male who arrives status post fall from tree. Patient states that the fall was approximately 10-12 feet. Patient landed on his back. Negative LOC. Patient underwent workup per EDP. CT scans reveal L3 lumbar fractures fracture, TP fractures of L2-5. Left ninth rib fracture. Small pneumothorax.  Surgery was consulted for admission.  History reviewed. No pertinent past medical history.  History reviewed. No pertinent past surgical history.  No family history on file. Social History:  reports that he has been smoking.  He does not have any smokeless tobacco history on file. He reports that he drinks alcohol. He reports that he uses illicit drugs (Marijuana).  Allergies  No Known Allergies  Home Medications   (Not in a hospital admission)  Trauma Course   Results for orders placed or performed during the hospital encounter of 06/12/15 (from the past 48 hour(s))  CBC with Differential/Platelet     Status: Abnormal   Collection Time: 06/12/15 11:35 PM  Result Value Ref Range   WBC 9.3 4.0 - 10.5 K/uL   RBC 4.97 4.22 - 5.81 MIL/uL   Hemoglobin 16.3 13.0 - 17.0 g/dL   HCT 45.0 39.0 - 52.0 %   MCV 90.5 78.0 - 100.0 fL   MCH 32.8 26.0 - 34.0 pg   MCHC 36.2 (H) 30.0 - 36.0 g/dL   RDW 13.1 11.5 - 15.5 %   Platelets 240 150 - 400 K/uL   Neutrophils Relative % 60 %   Neutro Abs 5.6 1.7 - 7.7 K/uL   Lymphocytes Relative 28 %   Lymphs Abs 2.6 0.7 - 4.0 K/uL   Monocytes Relative 12 %   Monocytes Absolute 1.1 (H) 0.1 - 1.0 K/uL   Eosinophils Relative 1 %   Eosinophils Absolute 0.1 0.0 - 0.7 K/uL   Basophils Relative 0 %   Basophils Absolute 0.0 0.0 - 0.1 K/uL  Comprehensive metabolic panel     Status: Abnormal   Collection Time: 06/12/15 11:35 PM  Result Value Ref Range    Sodium 137 135 - 145 mmol/L   Potassium 3.1 (L) 3.5 - 5.1 mmol/L   Chloride 100 (L) 101 - 111 mmol/L   CO2 25 22 - 32 mmol/L   Glucose, Bld 109 (H) 65 - 99 mg/dL   BUN 10 6 - 20 mg/dL   Creatinine, Ser 1.03 0.61 - 1.24 mg/dL   Calcium 9.4 8.9 - 10.3 mg/dL   Total Protein 7.2 6.5 - 8.1 g/dL   Albumin 4.2 3.5 - 5.0 g/dL   AST 37 15 - 41 U/L   ALT 25 17 - 63 U/L   Alkaline Phosphatase 76 38 - 126 U/L   Total Bilirubin 1.5 (H) 0.3 - 1.2 mg/dL   GFR calc non Af Amer >60 >60 mL/min   GFR calc Af Amer >60 >60 mL/min    Comment: (NOTE) The eGFR has been calculated using the CKD EPI equation. This calculation has not been validated in all clinical situations. eGFR's persistently <60 mL/min signify possible Chronic Kidney Disease.    Anion gap 12 5 - 15  I-Stat CG4 Lactic Acid, ED     Status: Abnormal   Collection Time: 06/12/15 11:51 PM  Result Value Ref Range   Lactic Acid, Venous 2.22 (HH)  0.5 - 2.0 mmol/L   Comment NOTIFIED PHYSICIAN   Urinalysis, Routine w reflex microscopic (not at [facility])     Status: Abnormal   Collection Time: 06/13/15  1:31 AM  Result Value Ref Range   Color, Urine YELLOW YELLOW   APPearance CLEAR CLEAR   Specific Gravity, Urine >1.046 (H) 1.005 - 1.030   pH 6.0 5.0 - 8.0   Glucose, UA NEGATIVE NEGATIVE mg/dL   Hgb urine dipstick TRACE (A) NEGATIVE   Bilirubin Urine NEGATIVE NEGATIVE   Ketones, ur 15 (A) NEGATIVE mg/dL   Protein, ur 30 (A) NEGATIVE mg/dL   Urobilinogen, UA 0.2 0.0 - 1.0 mg/dL   Nitrite NEGATIVE NEGATIVE   Leukocytes, UA SMALL (A) NEGATIVE  Urine microscopic-add on     Status: None   Collection Time: 06/13/15  1:31 AM  Result Value Ref Range   Squamous Epithelial / LPF RARE RARE   WBC, UA 3-6 <3 WBC/hpf   RBC / HPF 0-2 <3 RBC/hpf   Bacteria, UA RARE RARE   Ct Head Wo Contrast  06/13/2015   CLINICAL DATA:  Low back pain after falling from 10-12 foot tree. No loss of consciousness. Alcohol on board.  EXAM: CT HEAD WITHOUT CONTRAST  CT  CERVICAL SPINE WITHOUT CONTRAST  TECHNIQUE: Multidetector CT imaging of the head and cervical spine was performed following the standard protocol without intravenous contrast. Multiplanar CT image reconstructions of the cervical spine were also generated.  COMPARISON:  None.  FINDINGS: CT HEAD FINDINGS  Ventricles and sulci are symmetrical. No mass effect or midline shift. No abnormal extra-axial fluid collections. Gray-white matter junctions are distinct. Basal cisterns are not effaced. No evidence of acute intracranial hemorrhage. No depressed skull fractures. Visualized paranasal sinuses and mastoid air cells are not opacified.  CT CERVICAL SPINE FINDINGS  Normal alignment of the cervical spine. No vertebral compression deformities. Intervertebral disc space heights are preserved. No prevertebral soft tissue swelling. No focal bone lesion or bone destruction. C1-2 articulation appears intact. Bone cortex and trabecular architecture appear intact.  IMPRESSION: No acute intracranial abnormalities. Normal alignment of the cervical spine. No acute displaced fractures identified.   Electronically Signed   By: Lucienne Capers M.D.   On: 06/13/2015 02:00   Ct Chest W Contrast  06/13/2015   CLINICAL DATA:  Low back pain after falling from a 10-12 foot tree this evening. No loss of consciousness. Alcohol on board.  EXAM: CT CHEST, ABDOMEN, AND PELVIS WITH CONTRAST  TECHNIQUE: Multidetector CT imaging of the chest, abdomen and pelvis was performed following the standard protocol during bolus administration of intravenous contrast.  CONTRAST:  143m OMNIPAQUE IOHEXOL 300 MG/ML  SOLN  COMPARISON:  None.  FINDINGS: CT CHEST FINDINGS  Normal heart size. Normal caliber thoracic aorta. No evidence of aortic aneurysm, allowing for significant motion artifact. Great vessel origins are patent. Esophagus is decompressed. No abnormal mediastinal gas or fluid collections. No significant lymphadenopathy in the chest.  Posterior  infiltrates in the lungs likely representing pulmonary contusion. There is a tiny left anterior basilar pneumothorax. No pulmonary collapse or tension. Airways appear patent. No pleural effusions.  CT ABDOMEN PELVIS FINDINGS  Small hyper enhancing focus in segment 5 of the liver measuring 1.3 cm diameter. This is likely represent a small focal nodular hyperplasia or perhaps a small transient hepatic attenuation defect. No focal liver lesions that would suggest laceration. The gallbladder, spleen, pancreas, adrenal glands, kidneys, abdominal aorta, inferior vena cava, and retroperitoneal lymph nodes are unremarkable. Stomach is  filled with ingested fluid. No gastric distention. No small or large bowel distention. Small amount of gas in the left upper quadrant anterior abdominal wall. No free intra-abdominal air is identified. No free abdominal fluid. Abdominal wall musculature appears intact.  Pelvis: Prostate gland is not enlarged. Bladder wall is not thickened. No free or loculated pelvic fluid collections. No pelvic mass or lymphadenopathy. Appendix is not identified. No diverticulitis.  Bones: Comminuted fracture of the L3 vertebra with retropulsion of a posterior fracture fragment into the central canal resulting in severe narrowing of the central canal anteriorly. At the narrowest point, the central canal measures only about 5 mm AP dimension. Displaced fracture fragments also cause compromise of the neural foramina bilaterally at L3-4. Fractures of the left transverse process of L2, L3, L4, and L5. Sacrum, pelvis, and hips appear intact. Normal alignment of the thoracic spine. No thoracic vertebral compression fractures identified. Visualized shoulders and clavicles appear intact. Fracture of the left posterior ninth rib without displacement. Sternum is intact.  IMPRESSION: Burst compression fracture of the L3 vertebra with retropulsion of posterior fracture fragments causing severe narrowing of the central  canal and bilateral neural foramina. Fractures of the left transverse process of L2, L3, L4, and L5. Nondisplaced fracture of the left posterior ninth rib. Tiny left anterior pneumothorax without collapse or tension. Subcutaneous emphysema in the left anterior chest/abdominal wall. Bilateral posterior pulmonary contusions. No evidence of solid organ injury or bowel perforation. Small nonspecific hyper enhancing focus in segment 5 of the liver probably benign.  These results were called by telephone at the time of interpretation on 06/13/2015 at 2:11 am to Dr. Everlene Balls , who verbally acknowledged these results.   Electronically Signed   By: Lucienne Capers M.D.   On: 06/13/2015 02:14   Ct Cervical Spine Wo Contrast  06/13/2015   CLINICAL DATA:  Low back pain after falling from 10-12 foot tree. No loss of consciousness. Alcohol on board.  EXAM: CT HEAD WITHOUT CONTRAST  CT CERVICAL SPINE WITHOUT CONTRAST  TECHNIQUE: Multidetector CT imaging of the head and cervical spine was performed following the standard protocol without intravenous contrast. Multiplanar CT image reconstructions of the cervical spine were also generated.  COMPARISON:  None.  FINDINGS: CT HEAD FINDINGS  Ventricles and sulci are symmetrical. No mass effect or midline shift. No abnormal extra-axial fluid collections. Gray-white matter junctions are distinct. Basal cisterns are not effaced. No evidence of acute intracranial hemorrhage. No depressed skull fractures. Visualized paranasal sinuses and mastoid air cells are not opacified.  CT CERVICAL SPINE FINDINGS  Normal alignment of the cervical spine. No vertebral compression deformities. Intervertebral disc space heights are preserved. No prevertebral soft tissue swelling. No focal bone lesion or bone destruction. C1-2 articulation appears intact. Bone cortex and trabecular architecture appear intact.  IMPRESSION: No acute intracranial abnormalities. Normal alignment of the cervical spine. No  acute displaced fractures identified.   Electronically Signed   By: Lucienne Capers M.D.   On: 06/13/2015 02:00   Ct Abdomen Pelvis W Contrast  06/13/2015   CLINICAL DATA:  Low back pain after falling from a 10-12 foot tree this evening. No loss of consciousness. Alcohol on board.  EXAM: CT CHEST, ABDOMEN, AND PELVIS WITH CONTRAST  TECHNIQUE: Multidetector CT imaging of the chest, abdomen and pelvis was performed following the standard protocol during bolus administration of intravenous contrast.  CONTRAST:  121m OMNIPAQUE IOHEXOL 300 MG/ML  SOLN  COMPARISON:  None.  FINDINGS: CT CHEST FINDINGS  Normal heart  size. Normal caliber thoracic aorta. No evidence of aortic aneurysm, allowing for significant motion artifact. Great vessel origins are patent. Esophagus is decompressed. No abnormal mediastinal gas or fluid collections. No significant lymphadenopathy in the chest.  Posterior infiltrates in the lungs likely representing pulmonary contusion. There is a tiny left anterior basilar pneumothorax. No pulmonary collapse or tension. Airways appear patent. No pleural effusions.  CT ABDOMEN PELVIS FINDINGS  Small hyper enhancing focus in segment 5 of the liver measuring 1.3 cm diameter. This is likely represent a small focal nodular hyperplasia or perhaps a small transient hepatic attenuation defect. No focal liver lesions that would suggest laceration. The gallbladder, spleen, pancreas, adrenal glands, kidneys, abdominal aorta, inferior vena cava, and retroperitoneal lymph nodes are unremarkable. Stomach is filled with ingested fluid. No gastric distention. No small or large bowel distention. Small amount of gas in the left upper quadrant anterior abdominal wall. No free intra-abdominal air is identified. No free abdominal fluid. Abdominal wall musculature appears intact.  Pelvis: Prostate gland is not enlarged. Bladder wall is not thickened. No free or loculated pelvic fluid collections. No pelvic mass or  lymphadenopathy. Appendix is not identified. No diverticulitis.  Bones: Comminuted fracture of the L3 vertebra with retropulsion of a posterior fracture fragment into the central canal resulting in severe narrowing of the central canal anteriorly. At the narrowest point, the central canal measures only about 5 mm AP dimension. Displaced fracture fragments also cause compromise of the neural foramina bilaterally at L3-4. Fractures of the left transverse process of L2, L3, L4, and L5. Sacrum, pelvis, and hips appear intact. Normal alignment of the thoracic spine. No thoracic vertebral compression fractures identified. Visualized shoulders and clavicles appear intact. Fracture of the left posterior ninth rib without displacement. Sternum is intact.  IMPRESSION: Burst compression fracture of the L3 vertebra with retropulsion of posterior fracture fragments causing severe narrowing of the central canal and bilateral neural foramina. Fractures of the left transverse process of L2, L3, L4, and L5. Nondisplaced fracture of the left posterior ninth rib. Tiny left anterior pneumothorax without collapse or tension. Subcutaneous emphysema in the left anterior chest/abdominal wall. Bilateral posterior pulmonary contusions. No evidence of solid organ injury or bowel perforation. Small nonspecific hyper enhancing focus in segment 5 of the liver probably benign.  These results were called by telephone at the time of interpretation on 06/13/2015 at 2:11 am to Dr. Everlene Balls , who verbally acknowledged these results.   Electronically Signed   By: Lucienne Capers M.D.   On: 06/13/2015 02:14   Dg Pelvis Portable  06/13/2015   CLINICAL DATA:  Back pain after fall from tree at 10-12 feet.  EXAM: PORTABLE PELVIS 1-2 VIEWS  COMPARISON:  None.  FINDINGS: Mild widening of the symphysis pubis. Can't exclude pubic diastases versus normal variation. Internal pelvic line appears intact. SI joints are not displaced. No acute fractures  demonstrated in the pelvis or visualized hips.  IMPRESSION: Mild widening of the symphysis pubis. Can't exclude mild pubic diastases although this could be normal variation. No acute fractures demonstrated.   Electronically Signed   By: Lucienne Capers M.D.   On: 06/13/2015 00:04   Ct T-spine No Charge  06/13/2015   CLINICAL DATA:  Low back pain after a fall from 10-12 foot tree this evening. No loss of consciousness. Alcohol.  EXAM: CT THORACIC SPINE WITHOUT CONTRAST  TECHNIQUE: Multidetector CT imaging of the thoracic spine was performed without intravenous contrast administration. Multiplanar CT image reconstructions were also generated.  COMPARISON:  None.  FINDINGS: Normal alignment of the thoracic spine. No vertebral compression deformities. Intervertebral disc space heights are preserved. No focal bone lesion or bone destruction. Bone cortex and trabecular architecture appears intact. Incidental note of a nondisplaced fracture of the left posterior ninth rib. No paraspinal soft tissue swelling or infiltration.  IMPRESSION: Normal alignment of the thoracic spine. No acute displaced fractures identified. Nondisplaced fracture of the left posterior ninth rib.   Electronically Signed   By: Lucienne Capers M.D.   On: 06/13/2015 01:56   Ct L-spine No Charge  06/13/2015   CLINICAL DATA:  Low back pain after a fall 10-12 feet from a tree. Alcohol on board.  EXAM: CT LUMBAR SPINE WITHOUT CONTRAST  TECHNIQUE: Multidetector CT imaging of the lumbar spine was performed without intravenous contrast administration. Multiplanar CT image reconstructions were also generated.  COMPARISON:  None.  FINDINGS: There is a burst compression fracture of the L3 vertebra with about 20% loss of height diffusely. There is retropulsion of comminuted fracture fragments causing severe narrowing of the central canal, which measures only about 5 mm in AP dimension. Fracture fragments extend to cause significant effacement of the left  lateral recess and of the neural foramina bilaterally. No anterior subluxation. Remaining lumbar vertebrae demonstrate normal alignment. No additional compression deformities are demonstrated. Fractures of the left transverse process of L2, L3, L4, and L5. Visualize sacrum appears intact.  IMPRESSION: Burst compression fracture the L3 vertebra with retropulsion of fracture fragments causing significant effacement of the central canal and bilateral neural foramina. Fractures of the left transverse process of L2, L3, L4, and L5.   Electronically Signed   By: Lucienne Capers M.D.   On: 06/13/2015 02:19    Review of Systems  Constitutional: Negative.   HENT: Negative.   Eyes: Negative.   Respiratory: Negative.   Cardiovascular: Negative.   Gastrointestinal: Negative.   Musculoskeletal: Positive for back pain (LOWER LUMBAR).  Skin: Negative.   Neurological: Negative.  Negative for tingling, sensory change, focal weakness and loss of consciousness.    Blood pressure 132/57, pulse 73, temperature 97.9 F (36.6 C), temperature source Oral, resp. rate 23, SpO2 96 %. Physical Exam  Constitutional: He is oriented to person, place, and time. He appears well-developed and well-nourished.  HENT:  Head: Normocephalic and atraumatic.  Eyes: Conjunctivae and EOM are normal. Pupils are equal, round, and reactive to light.  Neck: Normal range of motion. Neck supple.  Cardiovascular: Normal rate, regular rhythm and normal heart sounds.   Respiratory: Effort normal and breath sounds normal.  GI: Soft. Bowel sounds are normal. He exhibits no distension. There is no tenderness. There is no rebound and no guarding.  Musculoskeletal: Normal range of motion.       Lumbar back: He exhibits tenderness (lumbar spine ttp).  Neurological: He is alert and oriented to person, place, and time.  Skin: Skin is warm and dry.     Assessment/Plan 19 year old male status post fall from tree.  1. L3 burst fracture with  retropulsion into the canal. 2. L2-5 TP fractures 3. Tiny pneumothorax 4. Nondisplaced ninth rib fracture  1. Dr. Saintclair Halsted of neurosurgery is to see the patient and discuss any further treatment/surgery. 2. We will admit the patient to stepdown for pain control, pulmonary toilet  Rosario Jacks., Arm[place] 06/13/2015, 4:09 AM   Procedures

## 2015-06-13 NOTE — Progress Notes (Signed)
Patient ID: Michael Lawrence, male   DOB: [birth year], 19 y.o.   MRN: [id number]  Patient doing well.    Post op will need follow up CXR to follow up occult pneumothorax.

## 2015-06-13 NOTE — Consult Note (Signed)
Reason for Consult: Emergency department Referring Physician: Dr. Kalman Shan is an 19 y.o. male.  HPI: 19 year old gentleman who climbed tree fell somewhere between 10-15 feet landing on his back currently is denying any numbness and tingling in his legs denies any weakness in his legs he is having some pain in his back and across his chest but otherwise no other complaints.  History reviewed. No pertinent past medical history.  History reviewed. No pertinent past surgical history.  No family history on file.  Social History:  reports that he has been smoking.  He does not have any smokeless tobacco history on file. He reports that he drinks alcohol. He reports that he uses illicit drugs (Marijuana).  Allergies: No Known Allergies  Medications: I have reviewed the patient's current medications.  Results for orders placed or performed during the hospital encounter of 06/12/15 (from the past 48 hour(s))  CBC with Differential/Platelet     Status: Abnormal   Collection Time: 06/12/15 11:35 PM  Result Value Ref Range   WBC 9.3 4.0 - 10.5 K/uL   RBC 4.97 4.22 - 5.81 MIL/uL   Hemoglobin 16.3 13.0 - 17.0 g/dL   HCT 45.0 39.0 - 52.0 %   MCV 90.5 78.0 - 100.0 fL   MCH 32.8 26.0 - 34.0 pg   MCHC 36.2 (H) 30.0 - 36.0 g/dL   RDW 13.1 11.5 - 15.5 %   Platelets 240 150 - 400 K/uL   Neutrophils Relative % 60 %   Neutro Abs 5.6 1.7 - 7.7 K/uL   Lymphocytes Relative 28 %   Lymphs Abs 2.6 0.7 - 4.0 K/uL   Monocytes Relative 12 %   Monocytes Absolute 1.1 (H) 0.1 - 1.0 K/uL   Eosinophils Relative 1 %   Eosinophils Absolute 0.1 0.0 - 0.7 K/uL   Basophils Relative 0 %   Basophils Absolute 0.0 0.0 - 0.1 K/uL  Comprehensive metabolic panel     Status: Abnormal   Collection Time: 06/12/15 11:35 PM  Result Value Ref Range   Sodium 137 135 - 145 mmol/L   Potassium 3.1 (L) 3.5 - 5.1 mmol/L   Chloride 100 (L) 101 - 111 mmol/L   CO2 25 22 - 32 mmol/L   Glucose, Bld 109 (H) 65 - 99 mg/dL    BUN 10 6 - 20 mg/dL   Creatinine, Ser 1.03 0.61 - 1.24 mg/dL   Calcium 9.4 8.9 - 10.3 mg/dL   Total Protein 7.2 6.5 - 8.1 g/dL   Albumin 4.2 3.5 - 5.0 g/dL   AST 37 15 - 41 U/L   ALT 25 17 - 63 U/L   Alkaline Phosphatase 76 38 - 126 U/L   Total Bilirubin 1.5 (H) 0.3 - 1.2 mg/dL   GFR calc non Af Amer >60 >60 mL/min   GFR calc Af Amer >60 >60 mL/min    Comment: (NOTE) The eGFR has been calculated using the CKD EPI equation. This calculation has not been validated in all clinical situations. eGFR's persistently <60 mL/min signify possible Chronic Kidney Disease.    Anion gap 12 5 - 15  I-Stat CG4 Lactic Acid, ED     Status: Abnormal   Collection Time: 06/12/15 11:51 PM  Result Value Ref Range   Lactic Acid, Venous 2.22 (HH) 0.5 - 2.0 mmol/L   Comment NOTIFIED PHYSICIAN   Urinalysis, Routine w reflex microscopic (not at [facility])     Status: Abnormal   Collection Time: 06/13/15  1:31 AM  Result Value  Ref Range   Color, Urine YELLOW YELLOW   APPearance CLEAR CLEAR   Specific Gravity, Urine >1.046 (H) 1.005 - 1.030   pH 6.0 5.0 - 8.0   Glucose, UA NEGATIVE NEGATIVE mg/dL   Hgb urine dipstick TRACE (A) NEGATIVE   Bilirubin Urine NEGATIVE NEGATIVE   Ketones, ur 15 (A) NEGATIVE mg/dL   Protein, ur 30 (A) NEGATIVE mg/dL   Urobilinogen, UA 0.2 0.0 - 1.0 mg/dL   Nitrite NEGATIVE NEGATIVE   Leukocytes, UA SMALL (A) NEGATIVE  Urine microscopic-add on     Status: None   Collection Time: 06/13/15  1:31 AM  Result Value Ref Range   Squamous Epithelial / LPF RARE RARE   WBC, UA 3-6 <3 WBC/hpf   RBC / HPF 0-2 <3 RBC/hpf   Bacteria, UA RARE RARE    Ct Head Wo Contrast  06/13/2015   CLINICAL DATA:  Low back pain after falling from 10-12 foot tree. No loss of consciousness. Alcohol on board.  EXAM: CT HEAD WITHOUT CONTRAST  CT CERVICAL SPINE WITHOUT CONTRAST  TECHNIQUE: Multidetector CT imaging of the head and cervical spine was performed following the standard protocol without intravenous  contrast. Multiplanar CT image reconstructions of the cervical spine were also generated.  COMPARISON:  None.  FINDINGS: CT HEAD FINDINGS  Ventricles and sulci are symmetrical. No mass effect or midline shift. No abnormal extra-axial fluid collections. Gray-white matter junctions are distinct. Basal cisterns are not effaced. No evidence of acute intracranial hemorrhage. No depressed skull fractures. Visualized paranasal sinuses and mastoid air cells are not opacified.  CT CERVICAL SPINE FINDINGS  Normal alignment of the cervical spine. No vertebral compression deformities. Intervertebral disc space heights are preserved. No prevertebral soft tissue swelling. No focal bone lesion or bone destruction. C1-2 articulation appears intact. Bone cortex and trabecular architecture appear intact.  IMPRESSION: No acute intracranial abnormalities. Normal alignment of the cervical spine. No acute displaced fractures identified.   Electronically Signed   By: Lucienne Capers M.D.   On: 06/13/2015 02:00   Ct Chest W Contrast  06/13/2015   CLINICAL DATA:  Low back pain after falling from a 10-12 foot tree this evening. No loss of consciousness. Alcohol on board.  EXAM: CT CHEST, ABDOMEN, AND PELVIS WITH CONTRAST  TECHNIQUE: Multidetector CT imaging of the chest, abdomen and pelvis was performed following the standard protocol during bolus administration of intravenous contrast.  CONTRAST:  138m OMNIPAQUE IOHEXOL 300 MG/ML  SOLN  COMPARISON:  None.  FINDINGS: CT CHEST FINDINGS  Normal heart size. Normal caliber thoracic aorta. No evidence of aortic aneurysm, allowing for significant motion artifact. Great vessel origins are patent. Esophagus is decompressed. No abnormal mediastinal gas or fluid collections. No significant lymphadenopathy in the chest.  Posterior infiltrates in the lungs likely representing pulmonary contusion. There is a tiny left anterior basilar pneumothorax. No pulmonary collapse or tension. Airways appear  patent. No pleural effusions.  CT ABDOMEN PELVIS FINDINGS  Small hyper enhancing focus in segment 5 of the liver measuring 1.3 cm diameter. This is likely represent a small focal nodular hyperplasia or perhaps a small transient hepatic attenuation defect. No focal liver lesions that would suggest laceration. The gallbladder, spleen, pancreas, adrenal glands, kidneys, abdominal aorta, inferior vena cava, and retroperitoneal lymph nodes are unremarkable. Stomach is filled with ingested fluid. No gastric distention. No small or large bowel distention. Small amount of gas in the left upper quadrant anterior abdominal wall. No free intra-abdominal air is identified. No free abdominal fluid.  Abdominal wall musculature appears intact.  Pelvis: Prostate gland is not enlarged. Bladder wall is not thickened. No free or loculated pelvic fluid collections. No pelvic mass or lymphadenopathy. Appendix is not identified. No diverticulitis.  Bones: Comminuted fracture of the L3 vertebra with retropulsion of a posterior fracture fragment into the central canal resulting in severe narrowing of the central canal anteriorly. At the narrowest point, the central canal measures only about 5 mm AP dimension. Displaced fracture fragments also cause compromise of the neural foramina bilaterally at L3-4. Fractures of the left transverse process of L2, L3, L4, and L5. Sacrum, pelvis, and hips appear intact. Normal alignment of the thoracic spine. No thoracic vertebral compression fractures identified. Visualized shoulders and clavicles appear intact. Fracture of the left posterior ninth rib without displacement. Sternum is intact.  IMPRESSION: Burst compression fracture of the L3 vertebra with retropulsion of posterior fracture fragments causing severe narrowing of the central canal and bilateral neural foramina. Fractures of the left transverse process of L2, L3, L4, and L5. Nondisplaced fracture of the left posterior ninth rib. Tiny left  anterior pneumothorax without collapse or tension. Subcutaneous emphysema in the left anterior chest/abdominal wall. Bilateral posterior pulmonary contusions. No evidence of solid organ injury or bowel perforation. Small nonspecific hyper enhancing focus in segment 5 of the liver probably benign.  These results were called by telephone at the time of interpretation on 06/13/2015 at 2:11 am to Dr. Everlene Balls , who verbally acknowledged these results.   Electronically Signed   By: Lucienne Capers M.D.   On: 06/13/2015 02:14   Ct Cervical Spine Wo Contrast  06/13/2015   CLINICAL DATA:  Low back pain after falling from 10-12 foot tree. No loss of consciousness. Alcohol on board.  EXAM: CT HEAD WITHOUT CONTRAST  CT CERVICAL SPINE WITHOUT CONTRAST  TECHNIQUE: Multidetector CT imaging of the head and cervical spine was performed following the standard protocol without intravenous contrast. Multiplanar CT image reconstructions of the cervical spine were also generated.  COMPARISON:  None.  FINDINGS: CT HEAD FINDINGS  Ventricles and sulci are symmetrical. No mass effect or midline shift. No abnormal extra-axial fluid collections. Gray-white matter junctions are distinct. Basal cisterns are not effaced. No evidence of acute intracranial hemorrhage. No depressed skull fractures. Visualized paranasal sinuses and mastoid air cells are not opacified.  CT CERVICAL SPINE FINDINGS  Normal alignment of the cervical spine. No vertebral compression deformities. Intervertebral disc space heights are preserved. No prevertebral soft tissue swelling. No focal bone lesion or bone destruction. C1-2 articulation appears intact. Bone cortex and trabecular architecture appear intact.  IMPRESSION: No acute intracranial abnormalities. Normal alignment of the cervical spine. No acute displaced fractures identified.   Electronically Signed   By: Lucienne Capers M.D.   On: 06/13/2015 02:00   Ct Abdomen Pelvis W Contrast  06/13/2015    CLINICAL DATA:  Low back pain after falling from a 10-12 foot tree this evening. No loss of consciousness. Alcohol on board.  EXAM: CT CHEST, ABDOMEN, AND PELVIS WITH CONTRAST  TECHNIQUE: Multidetector CT imaging of the chest, abdomen and pelvis was performed following the standard protocol during bolus administration of intravenous contrast.  CONTRAST:  151m OMNIPAQUE IOHEXOL 300 MG/ML  SOLN  COMPARISON:  None.  FINDINGS: CT CHEST FINDINGS  Normal heart size. Normal caliber thoracic aorta. No evidence of aortic aneurysm, allowing for significant motion artifact. Great vessel origins are patent. Esophagus is decompressed. No abnormal mediastinal gas or fluid collections. No significant lymphadenopathy in the  chest.  Posterior infiltrates in the lungs likely representing pulmonary contusion. There is a tiny left anterior basilar pneumothorax. No pulmonary collapse or tension. Airways appear patent. No pleural effusions.  CT ABDOMEN PELVIS FINDINGS  Small hyper enhancing focus in segment 5 of the liver measuring 1.3 cm diameter. This is likely represent a small focal nodular hyperplasia or perhaps a small transient hepatic attenuation defect. No focal liver lesions that would suggest laceration. The gallbladder, spleen, pancreas, adrenal glands, kidneys, abdominal aorta, inferior vena cava, and retroperitoneal lymph nodes are unremarkable. Stomach is filled with ingested fluid. No gastric distention. No small or large bowel distention. Small amount of gas in the left upper quadrant anterior abdominal wall. No free intra-abdominal air is identified. No free abdominal fluid. Abdominal wall musculature appears intact.  Pelvis: Prostate gland is not enlarged. Bladder wall is not thickened. No free or loculated pelvic fluid collections. No pelvic mass or lymphadenopathy. Appendix is not identified. No diverticulitis.  Bones: Comminuted fracture of the L3 vertebra with retropulsion of a posterior fracture fragment into the  central canal resulting in severe narrowing of the central canal anteriorly. At the narrowest point, the central canal measures only about 5 mm AP dimension. Displaced fracture fragments also cause compromise of the neural foramina bilaterally at L3-4. Fractures of the left transverse process of L2, L3, L4, and L5. Sacrum, pelvis, and hips appear intact. Normal alignment of the thoracic spine. No thoracic vertebral compression fractures identified. Visualized shoulders and clavicles appear intact. Fracture of the left posterior ninth rib without displacement. Sternum is intact.  IMPRESSION: Burst compression fracture of the L3 vertebra with retropulsion of posterior fracture fragments causing severe narrowing of the central canal and bilateral neural foramina. Fractures of the left transverse process of L2, L3, L4, and L5. Nondisplaced fracture of the left posterior ninth rib. Tiny left anterior pneumothorax without collapse or tension. Subcutaneous emphysema in the left anterior chest/abdominal wall. Bilateral posterior pulmonary contusions. No evidence of solid organ injury or bowel perforation. Small nonspecific hyper enhancing focus in segment 5 of the liver probably benign.  These results were called by telephone at the time of interpretation on 06/13/2015 at 2:11 am to Dr. Everlene Balls , who verbally acknowledged these results.   Electronically Signed   By: Lucienne Capers M.D.   On: 06/13/2015 02:14   Dg Pelvis Portable  06/13/2015   CLINICAL DATA:  Back pain after fall from tree at 10-12 feet.  EXAM: PORTABLE PELVIS 1-2 VIEWS  COMPARISON:  None.  FINDINGS: Mild widening of the symphysis pubis. Can't exclude pubic diastases versus normal variation. Internal pelvic line appears intact. SI joints are not displaced. No acute fractures demonstrated in the pelvis or visualized hips.  IMPRESSION: Mild widening of the symphysis pubis. Can't exclude mild pubic diastases although this could be normal variation. No  acute fractures demonstrated.   Electronically Signed   By: Lucienne Capers M.D.   On: 06/13/2015 00:04   Ct T-spine No Charge  06/13/2015   CLINICAL DATA:  Low back pain after a fall from 10-12 foot tree this evening. No loss of consciousness. Alcohol.  EXAM: CT THORACIC SPINE WITHOUT CONTRAST  TECHNIQUE: Multidetector CT imaging of the thoracic spine was performed without intravenous contrast administration. Multiplanar CT image reconstructions were also generated.  COMPARISON:  None.  FINDINGS: Normal alignment of the thoracic spine. No vertebral compression deformities. Intervertebral disc space heights are preserved. No focal bone lesion or bone destruction. Bone cortex and trabecular architecture appears  intact. Incidental note of a nondisplaced fracture of the left posterior ninth rib. No paraspinal soft tissue swelling or infiltration.  IMPRESSION: Normal alignment of the thoracic spine. No acute displaced fractures identified. Nondisplaced fracture of the left posterior ninth rib.   Electronically Signed   By: Lucienne Capers M.D.   On: 06/13/2015 01:56   Ct L-spine No Charge  06/13/2015   CLINICAL DATA:  Low back pain after a fall 10-12 feet from a tree. Alcohol on board.  EXAM: CT LUMBAR SPINE WITHOUT CONTRAST  TECHNIQUE: Multidetector CT imaging of the lumbar spine was performed without intravenous contrast administration. Multiplanar CT image reconstructions were also generated.  COMPARISON:  None.  FINDINGS: There is a burst compression fracture of the L3 vertebra with about 20% loss of height diffusely. There is retropulsion of comminuted fracture fragments causing severe narrowing of the central canal, which measures only about 5 mm in AP dimension. Fracture fragments extend to cause significant effacement of the left lateral recess and of the neural foramina bilaterally. No anterior subluxation. Remaining lumbar vertebrae demonstrate normal alignment. No additional compression deformities  are demonstrated. Fractures of the left transverse process of L2, L3, L4, and L5. Visualize sacrum appears intact.  IMPRESSION: Burst compression fracture the L3 vertebra with retropulsion of fracture fragments causing significant effacement of the central canal and bilateral neural foramina. Fractures of the left transverse process of L2, L3, L4, and L5.   Electronically Signed   By: Lucienne Capers M.D.   On: 06/13/2015 02:19    Review of Systems  Constitutional: Negative.   HENT: Negative.   Eyes: Negative.   Respiratory: Negative.   Cardiovascular: Negative.   Gastrointestinal: Negative.   Genitourinary: Negative.   Musculoskeletal: Positive for myalgias and back pain.  Skin: Negative.   Psychiatric/Behavioral: Negative.    Blood pressure 132/57, pulse 73, temperature 97.9 F (36.6 C), temperature source Oral, resp. rate 23, SpO2 96 %. Physical Exam  Constitutional: He is oriented to person, place, and time. He appears well-developed and well-nourished.  HENT:  Head: Normocephalic.  Eyes: Pupils are equal, round, and reactive to light.  Neck: Normal range of motion.  Respiratory: Effort normal and breath sounds normal.  GI: Soft. Bowel sounds are normal.  Neurological: He is alert and oriented to person, place, and time. He has normal strength. GCS eye subscore is 4. GCS verbal subscore is 5. GCS motor subscore is 6.  Strength is 5 out of 5 in his iliopsoas, quads, hip she's, gastrocs, and tibialis, and EHL.  Skin: Skin is warm and dry.    Assessment/Plan: 19 year old gentleman with comminuted L3 burst fracture with 90% canal compromise with minimal kyphosis and minimal loss of height. I recommended transpedicular decompression and fusion from L2-L4. I've extensively gone over the risks and benefits of that operation with the patient as well as perioperative course expectations of outcome and alternatives of surgery and he understands and agrees to proceed forward. We'll  tentatively plan this for later on this morning.  CRAM,GARY P 06/13/2015, 4:28 AM

## 2015-06-13 NOTE — Anesthesia Procedure Notes (Signed)
Procedure Name: Intubation Date/Time: 06/13/2015 10:57 AM Performed by: Dorie Rank Pre-anesthesia Checklist: Patient identified, Emergency Drugs available, Suction available, Patient being monitored and Timeout performed Patient Re-evaluated:Patient Re-evaluated prior to inductionOxygen Delivery Method: Circle system utilized Preoxygenation: Pre-oxygenation with 100% oxygen Intubation Type: IV induction Ventilation: Mask ventilation without difficulty Laryngoscope Size: Mac and 3 Grade View: Grade I Tube type: Oral Tube size: 7.5 mm Number of attempts: 1 Airway Equipment and Method: Stylet Placement Confirmation: ETT inserted through vocal cords under direct vision,  positive ETCO2 and breath sounds checked- equal and bilateral Secured at: 22 cm Tube secured with: Tape Dental Injury: Teeth and Oropharynx as per pre-operative assessment

## 2015-06-13 NOTE — Op Note (Signed)
Preoperative diagnosis: L3 burst fracture  Postoperative diagnosis: Same  Procedure: #1 decompressive lumbar laminectomy L3-4 with complete medial facetectomy on the left and partial transpedicular decompression of the retropulsed bone fragments.  #2 pedicle screw fixation L2-L4 using the globus Creo amp 5.5 pedicle screw system with 6 5 x 45 screws inserted at L2 and L4 bilaterally  #3 posterior lateral arthrodesis L2-L4 using locally harvested autograft mixed with allostem morsels and strips  #4 placement of a medium Hemovac drain  Surgeon: Michael Lawrence  Anesthesia: Gen.  EBL: 650  History of present illness: Patient is a 19 year old gentleman who was climbing a tree and fell from a tree sustaining a L3 burst fracture with severe canal stenosis from a large retropulsed fragment displacement thecal sac. 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 I extensively went over the risks and benefits of the operation with the patient as well as perioperative course expectations of outcome and alternatives of surgery he understands and agrees to proceed forward.  Operative procedure: Patient brought into the or was induced under general anesthesia positioned prone the Wilson frame his back was prepped and draped in routine sterile fashion preoperative x-ray localize the appropriate level so after infiltration of 10 mL lidocaine with epi a midline incision was made and Bovie light cautery was used to 10 subcutaneous tissues and subperiosteal dissections care lamina of L2-3 and 4 bilaterally. TPs were exposed at L2-L3 and L4. Interoperative x-ray confirmed defecation appropriate level so the spinous process of L3 was removed complete central decompression was begun I did a complete facetectomy on the left side as the vast majority of the retropulsed fragments were left of center.  Radical foraminotomies of both the L3 and L4 nerve roots on that side with aggressive undercutting of the superior articulating facet at coagulum the epidural veins and working from the disc space using it utilizing curettes and Scovel curettes and I pushed the retropulsed fragments back into the vertebrectomy defect. Working from the inferior aspect of L3 working on the medial aspect of pedicle that was partially fractured mobilized all this freeing up the 3 root freeing up the ventral thecal sac working down to the disc space until it was completely decompressed. I was able pass a probe all across the midline to confirm adequate decompression. There was a small amount of spinal fluid that was visualized in the axilla of the 3 root that had been created from the fractured bone. Without retraction thecal sac this was not leaking. After adequate decompression been achieved pedicle screws were placed at L2 and L4 by cannulated the pedicle with an awl after pilot holes were drilled probed With a 55 Probed again and 6 5 x 45 screws inserted bilaterally fluoroscopy was used the step along the way. All pedicles were competent. Postop films showed good position of all the implants. Then the wound scope was irrigated meticulous hemostasis was maintained aggressive decortication was care MTPs or lateral facet complexes from L2-L4 and the local autograft mixed was packed posterior laterally as well as the strips of Allostem. Then 285 mm rods were placed top tightening nuts were anchored in place the foramina were then reinspected confirm patency and adequate decompression I squirted some DuraSeal down the axilla of the 3 root then laid some Gelfoam and squirted some more DuraSeal there I did place a medium Hemovac drain I placed a cross-link and then closed after spraying  vancomycin powder and the wound closed the fascia injected Experell and closed the subcutaneous tissue and subcuticular with of Vicryls. Dermabond benzo and  Steri-Strips and sterile dressing were applied patient recovered in stable condition. At the end of case all needle counts sponge counts were correct.

## 2015-06-13 NOTE — Transfer of Care (Signed)
Immediate Anesthesia Transfer of Care Note  Patient: Michael Lawrence  Procedure(s) Performed: Procedure(s): POSTERIOR LUMBAR FUSION LUMBAR TWO-LUMBAR FOUR WITH LUMBAR THREE DECOMPRESSIVE LAMINECTOMY WITH PEDICLE SCREWS AND RODS (N/A)  Patient Location: PACU  Anesthesia Type:General  Level of Consciousness: awake, alert  and oriented  Airway & Oxygen Therapy: Patient connected to face mask oxygen  Post-op Assessment: Report given to RN  Post vital signs: stable  Last Vitals:  Filed Vitals:   06/13/15 0815  BP:   Pulse:   Temp: 36.6 C  Resp:     Complications: No apparent anesthesia complications

## 2015-06-14 ENCOUNTER — Encounter (HOSPITAL_COMMUNITY): Payer: Self-pay | Admitting: Neurosurgery

## 2015-06-14 ENCOUNTER — Inpatient Hospital Stay (HOSPITAL_COMMUNITY): Payer: BLUE CROSS/BLUE SHIELD

## 2015-06-14 MED ORDER — OXYCODONE HCL 5 MG PO TABS
5.0000 mg | ORAL_TABLET | ORAL | Status: DC | PRN
  Administered 2015-06-14: 10 mg via ORAL
  Administered 2015-06-15 – 2015-06-16 (×4): 15 mg via ORAL
  Administered 2015-06-17 (×2): 10 mg via ORAL
  Filled 2015-06-14 (×2): qty 3
  Filled 2015-06-14: qty 2
  Filled 2015-06-14 (×3): qty 3
  Filled 2015-06-14: qty 2
  Filled 2015-06-14 (×2): qty 3

## 2015-06-14 MED ORDER — TRAMADOL HCL 50 MG PO TABS
50.0000 mg | ORAL_TABLET | Freq: Four times a day (QID) | ORAL | Status: DC
  Administered 2015-06-14 – 2015-06-17 (×13): 50 mg via ORAL
  Filled 2015-06-14 (×13): qty 1

## 2015-06-14 NOTE — Care Management Note (Signed)
Case Management Note  Patient Details  Name: Michael Lawrence MRN: [id number] Date of Birth: [birth year]  Subjective/Objective:    Pt admitted on 06/12/15 s/p fall from tree with L3 burst fx.  PTA, pt independent, lives with roommates.                Action/Plan: Will follow for discharge planning as pt progresses.  Expected Discharge Date:                  Expected Discharge Plan:  Home w Home Health Services  In-House Referral:     Discharge planning Services  CM Consult  Post Acute Care Choice:    Choice offered to:     DME Arranged:    DME Agency:     HH Arranged:    HH Agency:     Status of Service:  In process, will continue to follow  Medicare Important Message Given:    Date Medicare IM Given:    Medicare IM give by:    Date Additional Medicare IM Given:    Additional Medicare Important Message give by:     If discussed at Long Length of Stay Meetings, dates discussed:    Additional Comments:  Quintella Baton, RN, BSN  Trauma/Neuro ICU Case Manager [phone]

## 2015-06-14 NOTE — Progress Notes (Signed)
Patient ID: Michael Lawrence, male   DOB: 01-11-1996, 19 y.o.   MRN: [id number] Patient doing well no leg pain no new numbness or tingling back pain well controlled on meds. No significant headache.  Strength 5 out of 5 wound clean dry and intact  Continue bedrest for another 24 hours remove Hemovac. Mobilized tomorrow in brace.

## 2015-06-14 NOTE — Progress Notes (Signed)
Report called to Chrissie Noa, receiving nurse.

## 2015-06-14 NOTE — Progress Notes (Signed)
Orthopedic Tech Progress Note Patient Details:  Michael Lawrence 08-22-1996 [id number] Brace order completed by bio-tech vendor. Patient ID: [name], male   DOB: 08-20-1996, 19 y.o.   MRN: [id number]   Jennye Moccasin 06/14/2015, 5:28 PM

## 2015-06-14 NOTE — Progress Notes (Signed)
Pt arrived on unit 1950 hrs, A&O, oriented to room and equipment

## 2015-06-14 NOTE — Progress Notes (Addendum)
Patient ID: Michael Lawrence, male   DOB: [birth year], 19 y.o.   MRN: [id number] 1 Day Post-Op  Subjective: Some MM spasms, numbness R thigh is gone now. Has bees kept flat per NS.  Objective: Vital signs in last 24 hours: Temp:  [98.3 F (36.8 C)-99 F (37.2 C)] 98.3 F (36.8 C) (09/26 0755) Pulse Rate:  [78-108] 78 (09/26 0755) Resp:  [16-27] 25 (09/26 0755) BP: (117-140)/(45-86) 140/86 mmHg (09/26 0755) SpO2:  [94 %-100 %] 97 % (09/26 0755) Last BM Date: 06/12/15  Intake/Output from previous day: 09/25 0701 - 09/26 0700 In: 6010 [P.O.:960; I.V.:4700; IV Piggyback:350] Out: 3060 [Urine:1960; Drains:450; Blood:650] Intake/Output this shift:    General appearance: alert and cooperative Resp: clear to auscultation bilaterally Cardio: regular rate and rhythm GI: soft, NT, ND, few BS Extremities: calves soft, no deformity Neuro: moves BLE well, LTS intact  Lab Results: CBC   Recent Labs  06/12/15 2335 06/13/15 0431  WBC 9.3 15.6*  HGB 16.3 15.7  HCT 45.0 44.9  PLT 240 236   BMET  Recent Labs  06/12/15 2335 06/13/15 0431  NA 137 137  K 3.1* 3.9  CL 100* 103  CO2 25 24  GLUCOSE 109* 113*  BUN 10 9  CREATININE 1.03 0.89  CALCIUM 9.4 9.2   Anti-infectives: Anti-infectives    Start     Dose/Rate Route Frequency Ordered Stop   06/13/15 1900  ceFAZolin (ANCEF) IVPB 2 g/50 mL premix     2 g 100 mL/hr over 30 Minutes Intravenous Every 8 hours 06/13/15 1546 06/14/15 0445   06/13/15 1410  vancomycin (VANCOCIN) powder  Status:  Discontinued       As needed 06/13/15 1411 06/13/15 1454   06/13/15 1144  bacitracin 50,000 Units in sodium chloride irrigation 0.9 % 500 mL irrigation  Status:  Discontinued       As needed 06/13/15 1145 06/13/15 1454      Assessment/Plan: Fall L3 burst FX - S/P fusion L2-1 by Dr. Wynetta Lawrence. Keep flat today as there was a dural tear from the injury. Brace being delivered now. Keep flat today, possibly mobilize tomorrow - I D/W Dr. Wynetta Lawrence. L 2-5  TVP FXs B pulm contusions/9th rib FX with occult PTX - F/U CXR with no PTX, pulm toilet FEN - clears and adv as tol, may have mild ileus. Adjust pain meds. VTE - PAS, per Dr. Wynetta Lawrence start Lovenox 9/28. 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 He is from [place] and Dr. Wynetta Lawrence spoke with his father yesterday.  LOS: 1 day    Michael Gelinas, MD, MPH, FACS Trauma: [phone] General Surgery: [phone]  06/14/2015

## 2015-06-14 NOTE — Progress Notes (Signed)
Orthopedic Tech Progress Note Patient Details:  Michael Lawrence 1996/02/05 [id number]  Called in bio-tech brace order; spoke with Anderson Malta, Rembert 06/14/2015, 9:15 AM

## 2015-06-15 LAB — CBC
HCT: 40.4 % (ref 39.0–52.0)
HEMOGLOBIN: 14.3 g/dL (ref 13.0–17.0)
MCH: 32.3 pg (ref 26.0–34.0)
MCHC: 35.4 g/dL (ref 30.0–36.0)
MCV: 91.2 fL (ref 78.0–100.0)
Platelets: 197 10*3/uL (ref 150–400)
RBC: 4.43 MIL/uL (ref 4.22–5.81)
RDW: 12.8 % (ref 11.5–15.5)
WBC: 15.9 10*3/uL — AB (ref 4.0–10.5)

## 2015-06-15 NOTE — Progress Notes (Signed)
Pt C./o pain 3-4 of 10 and requested pain medication, was offered PO pain meds  OxyIR which he refused at advised that his MD told him to take the IV meds when pain was this at this level, attempted to explain pain medications and effects to Pt however still refused PO meds./ IV Pain meds admionistered

## 2015-06-15 NOTE — Anesthesia Postprocedure Evaluation (Signed)
Anesthesia Post Note  Patient: Michael Lawrence  Procedure(s) Performed: Procedure(s) (LRB): POSTERIOR LUMBAR FUSION LUMBAR TWO-LUMBAR FOUR WITH LUMBAR THREE DECOMPRESSIVE LAMINECTOMY WITH PEDICLE SCREWS AND RODS (N/A)  Anesthesia type: General  Patient location: PACU  Post pain: Pain level controlled and Adequate analgesia  Post assessment: Post-op Vital signs reviewed, Patient's Cardiovascular Status Stable, Respiratory Function Stable, Patent Airway and Pain level controlled  Last Vitals:  Filed Vitals:   06/15/15 1029  BP: 149/80  Pulse: 89  Temp: 37.2 C  Resp: 36    Post vital signs: Reviewed and stable  Level of consciousness: awake, alert  and oriented  Complications: No apparent anesthesia complications

## 2015-06-15 NOTE — Evaluation (Signed)
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 S/P decompression with screw fixation of L3 on 06/13/15.   Clinical Impression  Pt admitted with above diagnosis. Pt currently with functional limitations due to the deficits listed below (see PT Problem List). Max assist for sidelying to sit, min A to stand. Pt ambulated 120' with RW. Good progress expected. Pt will benefit from skilled PT to increase their independence and safety with mobility to allow discharge to the venue listed below.       Follow Up Recommendations Outpatient PT    Equipment Recommendations  Rolling walker with 5" wheels    Recommendations for Other Services       Precautions / Restrictions Precautions Precautions: Back Required Braces or Orthoses: Spinal Brace Restrictions Weight Bearing Restrictions: No      Mobility  Bed Mobility Overal bed mobility: Needs Assistance Bed Mobility: Rolling;Sidelying to Sit Rolling: Min assist Sidelying to sit: Max assist       General bed mobility comments: verbal and manual cues for technique, max assist to raise trunk, limited by pain  Transfers Overall transfer level: Needs assistance Equipment used: Rolling walker (2 wheeled) Transfers: Sit to/from Stand Sit to Stand: Min assist         General transfer comment: Min A to power up, increased time for sit to/from stand, heavy reliance on BUEs  Ambulation/Gait Ambulation/Gait assistance: Supervision Ambulation Distance (Feet): 120 Feet Assistive device: Rolling walker (2 wheeled) Gait Pattern/deviations: Decreased step length - right;Decreased step length - left Gait velocity: decr Gait velocity interpretation: Below  normal speed for age/gender General Gait Details: steady with RW, pt reports no pain with walking, guarded with all movement  Stairs            Wheelchair Mobility    Modified Rankin (Stroke Patients Only)       Balance Overall balance assessment: Needs assistance Sitting-balance support: Feet supported Sitting balance-Leahy Scale: Good     Standing balance support: Bilateral upper extremity supported Standing balance-Leahy Scale: Poor Standing balance comment: needs BUE support due to back pain                             Pertinent Vitals/Pain Pain Assessment: 0-10 Pain Score: 8  Pain Location: back with sidelying to sit Pain Descriptors / Indicators: Sharp Pain Intervention(s): Limited activity within patient's tolerance;Monitored during session;Premedicated before session    Home Living Family/patient expects to be discharged to:: Private residence (off campus housing)   Available Help at Discharge: Friend(s);Available PRN/intermittently Type of Home: Apartment Home Access: Elevator     Home Layout: One level Home Equipment: None      Prior Function Level of Independence: Independent               Hand Dominance        Extremity/Trunk Assessment   Upper Extremity Assessment: Overall WFL for tasks assessed           Lower Extremity Assessment: Overall WFL for tasks assessed (pt reports R knee pain, no edema noted, RN notified, knee extension +4/5)      Cervical / Trunk Assessment: Normal  Communication   Communication: Prefers language other than English (pt is  from [place], speaks [place] well)  Cognition Arousal/Alertness: Awake/alert Behavior During Therapy: WFL for tasks assessed/performed Overall Cognitive Status: Within Functional Limits for tasks assessed                      General Comments      Exercises        Assessment/Plan    PT Assessment Patient needs continued PT services  PT Diagnosis  Difficulty walking;Acute pain   PT Problem List Decreased activity tolerance;Decreased balance;Pain;Decreased knowledge of use of DME;Decreased mobility  PT Treatment Interventions Gait training;DME instruction;Functional mobility training;Therapeutic activities;Patient/family education;Therapeutic exercise   PT Goals (Current goals can be found in the Care Plan section) Acute Rehab PT Goals Patient Stated Goal: return to classes at [employer] PT Goal Formulation: With patient Time For Goal Achievement: 06/29/15 Potential to Achieve Goals: Good    Frequency Min 5X/week   Barriers to discharge        Co-evaluation               End of Session Equipment Utilized During Treatment: Back brace Activity Tolerance: No increased pain;Patient tolerated treatment well Patient left: in chair;with call bell/phone within reach;with family/visitor present Nurse Communication: Mobility status         Time: 1610-9604 PT Time Calculation (min) (ACUTE ONLY): 31 min   Charges:   PT Evaluation $Initial PT Evaluation Tier I: 1 Procedure PT Treatments $Gait Training: 8-22 mins   PT G Codes:        Ralene Bathe Kistler 06/15/2015, 12:00 PM [phone]

## 2015-06-15 NOTE — Progress Notes (Signed)
[facility] Trauma Service  Progress Note   LOS: 2 days   Subjective: Pt in a lot of pain 9/10, pending pain meds.  Friend at bedside.  Anorexic, but will try fulls.  Not been OOB, HOB up to 17*.  Urinating well, no BM yet.  Not much flatus.  Objective: Vital signs in last 24 hours: Temp:  [97.9 F (36.6 C)-99.3 F (37.4 C)] 98.5 F (36.9 C) (09/27 4034) Pulse Rate:  [89-93] 93 (09/27 0633) Resp:  [24-31] 24 (09/27 0633) BP: (137-152)/(65-87) 146/81 mmHg (09/27 0633) SpO2:  [95 %-97 %] 97 % (09/27 7425) Weight:  [90.2 kg (198 lb 13.7 oz)] 90.2 kg (198 lb 13.7 oz) (09/26 2011) Last BM Date: 06/12/15  Lab Results:  CBC  Recent Labs  06/13/15 0431 06/15/15 0532  WBC 15.6* 15.9*  HGB 15.7 14.3  HCT 44.9 40.4  PLT 236 197   BMET  Recent Labs  06/12/15 2335 06/13/15 0431  NA 137 137  K 3.1* 3.9  CL 100* 103  CO2 25 24  GLUCOSE 109* 113*  BUN 10 9  CREATININE 1.03 0.89  CALCIUM 9.4 9.2    Imaging: Dg Lumbar Spine 2-3 Views  06/13/2015   CLINICAL DATA:  L2-4 fusion for patient with an L3 fracture after a fall today.  EXAM: LUMBAR SPINE - 2-3 VIEW; DG C-ARM 61-120 MIN  COMPARISON:  None.  FINDINGS: Two fluoroscopic spot views of the upper lumbar spine are provided. Pedicle screws are in place at L2 and L4. The L3-4 levels and decompressed.  IMPRESSION: L2-4 fusion and L3-4 decompression.   Electronically Signed   By: Drusilla Kanner M.D.   On: 06/13/2015 15:12   Dg Chest Port 1 View  06/14/2015   CLINICAL DATA:  Continued surveillance small pneumothorax post fall.  EXAM: PORTABLE CHEST 1 VIEW  COMPARISON:  06/13/2015.  FINDINGS: No visible pneumothorax. No change from yesterday's radiograph. Normal heart size with clear lung fields. No significant effusion. Nondisplaced LEFT ninth rib fracture redemonstrated.  IMPRESSION: No active disease.  No visible pneumothorax.   Electronically Signed   By: Elsie Stain M.D.   On: 06/14/2015 07:47   Dg Chest Port 1  View  06/13/2015   CLINICAL DATA:  Left pneumothorax.  EXAM: PORTABLE CHEST 1 VIEW  COMPARISON:  Chest CT 06/13/2015  FINDINGS: The previously seen tiny left pneumothorax by CT cannot be appreciated by plain film. No visible pneumothorax. Lungs are clear. Heart upper limits normal in size. No effusions or acute bony abnormality.  IMPRESSION: No visible pneumothorax.  No acute findings.   Electronically Signed   By: Charlett Nose M.D.   On: 06/13/2015 15:18   Dg C-arm 1-60 Min  06/13/2015   CLINICAL DATA:  L2-4 fusion for patient with an L3 fracture after a fall today.  EXAM: LUMBAR SPINE - 2-3 VIEW; DG C-ARM 61-120 MIN  COMPARISON:  None.  FINDINGS: Two fluoroscopic spot views of the upper lumbar spine are provided. Pedicle screws are in place at L2 and L4. The L3-4 levels and decompressed.  IMPRESSION: L2-4 fusion and L3-4 decompression.   Electronically Signed   By: Drusilla Kanner M.D.   On: 06/13/2015 15:12     PE: General: pleasant, WD/WN [place] male who is laying in bed in mild distress due to pain HEENT: head is normocephalic, atraumatic.  Sclera are noninjected.  PERRL.  Ears and nose without any masses or lesions.  Mouth is pink and moist Heart: regular, rate, and  rhythm.  Normal s1,s2. No obvious murmurs, gallops, or rubs noted.  Palpable radial and pedal pulses bilaterally Lungs: CTAB, no wheezes, rhonchi, or rales noted.  Respiratory effort non-labored, good effort Abd: soft, NT/ND, +BS, no masses, hernias, or organomegaly MS: all 4 extremities are symmetrical with no cyanosis, clubbing, or edema.  Distal CSM intact. Skin: warm and dry with no masses, lesions, or rashes Psych: A&Ox3 with an appropriate affect.   Assessment/Plan: Fall L3 burst FX - S/P fusion L2-1 by Dr. Wynetta Emery. Brace in place, progressive mobilization this afternoon, HOB 30* per Dr. Wynetta Emery. L 2-5 TVP FXs B pulm contusions/9th rib FX with occult PTX - F/U CXR with no PTX, pulm toilet FEN -fulls now, may have mild  ileus. Adjust pain meds. VTE - SCD's, per Dr. Wynetta Emery start Lovenox 9/28. Foley out. 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 He lives with 2 roommates. He is from [place] and Dr. Wynetta Emery spoke with his father 9/25   Jorje Guild, [place] [facility] PA Pager: [phone]   06/15/2015

## 2015-06-15 NOTE — Progress Notes (Signed)
Patient ID: Michael Lawrence, male   DOB: 12-18-1995, 19 y.o.   MRN: [id number] Patient doing well this morning no headache strength 5 out of 5 no leg pain occasional tingling in the right quad  Strength out of 5 wound clean dry and intact  Slowly raises head of bed this morning progressive mobilization this afternoon. Physical and occupational therapy

## 2015-06-16 MED ORDER — ENOXAPARIN SODIUM 40 MG/0.4ML ~~LOC~~ SOLN
40.0000 mg | SUBCUTANEOUS | Status: DC
  Administered 2015-06-16 – 2015-06-17 (×2): 40 mg via SUBCUTANEOUS
  Filled 2015-06-16 (×2): qty 0.4

## 2015-06-16 MED ORDER — HYDROMORPHONE HCL 1 MG/ML IJ SOLN
0.5000 mg | INTRAMUSCULAR | Status: DC | PRN
  Administered 2015-06-16: 0.5 mg via INTRAVENOUS
  Filled 2015-06-16: qty 1

## 2015-06-16 MED ORDER — ENSURE ENLIVE PO LIQD
237.0000 mL | Freq: Two times a day (BID) | ORAL | Status: DC
  Administered 2015-06-17: 237 mL via ORAL
  Filled 2015-06-16 (×6): qty 237

## 2015-06-16 NOTE — Progress Notes (Signed)
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 S/P decompression with screw fixation of L3 on 06/13/15.    Clinical Impression   Pt admitted with the above diagnoses and presents with below problem list. Pt will benefit from continued acute OT to address the below listed deficits and maximize independence with BADLs prior to d/c home. PTA pt was independent with ADLs. Pt is currently min A with most ADLs; min to max A with bed mobility. Session details below. Pt c/o 6/10 right knee pain. OT to continue to follow acutely.      Follow Up Recommendations  Supervision/Assistance - 24 hour;No OT follow up    Equipment Recommendations  3 in 1 bedside comode    Recommendations for Other Services       Precautions / Restrictions Precautions Precautions: Back Required Braces or Orthoses: Spinal Brace Spinal Brace: Lumbar corset Restrictions Weight Bearing Restrictions: No      Mobility Bed Mobility Overal bed mobility: Needs Assistance Bed Mobility: Rolling;Sit to Sidelying Rolling: Min assist      Sit to sidelying: Min assist General bed mobility comments: assist to advance BLE to maintain precautions during rolling; assist to powerup BLE during sit>sidelying  Transfers Overall transfer level: Needs assistance Equipment used: None Transfers: Sit to/from Stand Sit to Stand: Min guard         General transfer comment: Pt declined rw stating he did fine without it earlier today. cues for technique. Discussed boosting height of seat surfaces.     Balance Overall balance assessment: Needs assistance Sitting-balance support: No upper extremity supported Sitting balance-Leahy Scale:  Good     Standing balance support: No upper extremity supported;During functional activity Standing balance-Leahy Scale: Fair Standing balance comment: seeking external support during dynamic standing and functional mobility                            ADL Overall ADL's : Needs assistance/impaired Eating/Feeding: Set up;Sitting   Grooming: Min guard;Cueing for compensatory techniques;Standing   Upper Body Bathing: Minimal assitance;Sitting   Lower Body Bathing: Minimal assistance;Sit to/from stand   Upper Body Dressing : Minimal assistance;Sitting   Lower Body Dressing: Minimal assistance;Sit to/from stand;Cueing for compensatory techniques   Toilet Transfer: Min guard;Ambulation;BSC   Toileting- Clothing Manipulation and Hygiene: Minimal assistance;Sitting/lateral lean;Sit to/from stand   Tub/ Shower Transfer: Walk-in shower;Min guard;Ambulation;3 in 1   Functional mobility during ADLs: Min guard General ADL Comments: Pt with difficulty accessing feet in seted position. Pt in bathroom having just finished bathing upon therapist arrival. ADL education provided to pt. Pt reports he is suppose to wear back brace during bathing. Pt completed household distance functional mobility at close min guard level; noted to seek external support and had decreased speed. Moves very cautiously.      Vision     Perception     Praxis      Pertinent Vitals/Pain Pain Assessment: 0-10 Pain Score: 6  Pain Location: right knee with movement Pain Descriptors / Indicators: Sharp Pain Intervention(s): Limited activity within patient's tolerance;Monitored during session;Repositioned     Hand Dominance     Extremity/Trunk Assessment Upper Extremity Assessment Upper Extremity Assessment: Overall WFL for tasks assessed   Lower  Extremity Assessment Lower Extremity Assessment: Defer to PT evaluation       Communication Communication Communication: Prefers language other than  [place];Other (comment) (pt is from [place], speaks [place] well)   Cognition Arousal/Alertness: Awake/alert Behavior During Therapy: WFL for tasks assessed/performed Overall Cognitive Status: Within Functional Limits for tasks assessed                     General Comments       Exercises       Shoulder Instructions      Home Living Family/patient expects to be discharged to:: Private residence Living Arrangements: Non-relatives/Friends Available Help at Discharge: Friend(s);Available PRN/intermittently Type of Home: Apartment Home Access: Elevator     Home Layout: One level     Bathroom Shower/Tub: Producer, television/film/video: Standard     Home Equipment: None          Prior Functioning/Environment Level of Independence: Independent        Comments: Dietitian    OT Diagnosis: Acute pain   OT Problem List: Impaired balance (sitting and/or standing);Decreased knowledge of use of DME or AE;Decreased knowledge of precautions;Pain   OT Treatment/Interventions: Self-care/ADL training;DME and/or AE instruction;Therapeutic activities;Patient/family education;Balance training    OT Goals(Current goals can be found in the care plan section) Acute Rehab OT Goals Patient Stated Goal: return to classes at [employer] [employer] OT Goal Formulation: With patient Time For Goal Achievement: 06/23/15 Potential to Achieve Goals: Good ADL Goals Pt Will Perform Grooming: with modified independence;standing;with adaptive equipment Pt Will Perform Lower Body Bathing: with modified independence;with adaptive equipment;sit to/from stand Pt Will Perform Lower Body Dressing: with modified independence;with adaptive equipment;sit to/from stand Pt Will Transfer to Toilet: with modified independence;ambulating (3n1 over toilet) Pt Will Perform Toileting - Clothing Manipulation and hygiene: with modified independence;with adaptive equipment;sitting/lateral leans;sit to/from stand Pt Will  Perform Tub/Shower Transfer: Shower transfer;with modified independence;ambulating;3 in 1 Additional ADL Goal #1: Pt will complete bed mobility at mod I level to prepare for OOB ADLs.   OT Frequency: Min 2X/week   Barriers to D/C:            Co-evaluation              End of Session Equipment Utilized During Treatment: Back brace  Activity Tolerance: Patient tolerated treatment well Patient left: in bed;with call bell/phone within reach;with family/visitor present;with SCD's reapplied   Time: 1610-9604 OT Time Calculation (min): 34 min Charges:  OT General Charges $OT Visit: 1 Procedure OT Evaluation $Initial OT Evaluation Tier I: 1 Procedure OT Treatments $Self Care/Home Management : 8-22 mins G-Codes:    Pilar Grammes 07-11-2015, 2:28 PM

## 2015-06-16 NOTE — Clinical Social Work Note (Signed)
Clinical Social Work Assessment  Patient Details  Name: Michael Lawrence MRN: [id number] Date of Birth: May 30, 1996  Date of referral:  06/16/15               Reason for consult:  Trauma, Emotional/Coping/Adjustment to Illness, Other (Comment Required) (SBIRT completed)                Permission sought to share information with:  Case Manager, Customer service manager, Family Supports Permission granted to share information::  Yes, Verbal Permission Granted  Name::        Agency::  [employer] student  Relationship::  Patient father, however patient has been in contact with father and his Designer, fashion/clothing Information:     Housing/Transportation Living arrangements for the past 2 months:  Apartment ([place] housing) Source of Information:  Patient, Medical Team Patient Interpreter Needed:  None Criminal Activity/Legal Involvement Pertinent to Current Situation/Hospitalization:  No - Comment as needed Significant Relationships:  Other Family Members, Parents, Friend Lives with:    Do you feel safe going back to the place where you live?  Yes Need for family participation in patient care:  No (Coment) (patient has been in contact with family)  Care giving concerns:  No concerns from patient at this time. Plans to return to school at [employer] Father in touch with patient and will meet financial needs. NO PT needs at this time. Patient up walking with PT, in pain but no barriers to care   Social Worker assessment / plan: LCSW met with patient to provide support and SBIRT Patient adamantly denies use of SA and alcohol at time of fall from tree. Patient reports he was just climbing the tree as a dare and to show he could. His foot slipped on a branch and he reports it broke and he fell. He reports this has never happend before and reports he does not have a substance abuse problem nor issues with alcohol.  He does not deny ever using alcohol, however reports in this incident it was not part  of his system. No UDS was completed at this time to show evidence of drugs or alcohol. Patient alert and oriented, motivated and reports he plans to finish school and go to medical school.  Employment status:  Other (Comment) (full time Ship broker) Insurance information:     BCBS PT Recommendations:  Not assessed at this time Information / Referral to community resources:  SBIRT, Other (Comment Required) (none reported or needed at this time)  Patient/Family's Response to care:  No needs or problems with plans of care  Patient/Family's Understanding of and Emotional Response to Diagnosis, Current Treatment, and Prognosis:  Patient appears guarded with information leading to fall, but reports he is a full time student and understands the risks of climbing a tree and plans to not do this again.  Educated on alcohol and use.  Emotional Assessment Appearance:  Appears stated age Attitude/Demeanor/Rapport:  Other (cooperative and approrpiate) Affect (typically observed):  Stable, Accepting, Hopeful Orientation:  Oriented to Self, Oriented to Place, Oriented to  Time, Oriented to Situation Alcohol / Substance use:  Alcohol Use, Other (patient denies use of etoh) Psych involvement (Current and /or in the community):  No (Comment)  Discharge Needs  Concerns to be addressed:  No discharge needs identified Readmission within the last 30 days:  No Current discharge risk:  None Barriers to Discharge:  No Barriers Identified (Patient only requesting a school note at [place])   [place],  LCSW 06/16/2015, 12:11 PM

## 2015-06-16 NOTE — Progress Notes (Signed)
Physical Therapy Treatment Patient Details Name: Cypher Paule MRN: [id number] DOB: 11/14/1995 Today's Date: 06/16/2015    History of Present Illness 19 y.o. 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 S/P decompression with screw fixation of L3 on 06/13/15.     PT Comments    Patient progressing well with ambulation. Still limited with bed mobility. Educated to work on rolling and sitting EOB and ambulating with staff. Educated on 3/3 back precautions.   Follow Up Recommendations  Outpatient PT     Equipment Recommendations  Rolling walker with 5" wheels    Recommendations for Other Services       Precautions / Restrictions Precautions Precautions: Back Required Braces or Orthoses: Spinal Brace    Mobility  Bed Mobility Overal bed mobility: Needs Assistance   Rolling: Min assist Sidelying to sit: Max assist       General bed mobility comments: verbal and manual cues for technique, max assist to raise trunk, limited by pain  Transfers Overall transfer level: Needs assistance Equipment used: Rolling walker (2 wheeled)   Sit to Stand: Min guard         General transfer comment: Impulsive and quick due to pain  Ambulation/Gait Ambulation/Gait assistance: Min guard Ambulation Distance (Feet): 150 Feet Assistive device: None Gait Pattern/deviations: Step-through pattern;Decreased stride length   Gait velocity interpretation: Below normal speed for age/gender General Gait Details: CUes to increase step length and continue with step through pattern   Stairs            Wheelchair Mobility    Modified Rankin (Stroke Patients Only)       Balance                                    Cognition Arousal/Alertness: Awake/alert Behavior During Therapy: WFL for tasks assessed/performed Overall Cognitive  Status: Within Functional Limits for tasks assessed                      Exercises      General Comments        Pertinent Vitals/Pain Pain Score: 4  Pain Location: L side with ambulation Pain Descriptors / Indicators: Sharp Pain Intervention(s): Monitored during session    Home Living                      Prior Function            PT Goals (current goals can now be found in the care plan section) Progress towards PT goals: Progressing toward goals    Frequency  Min 5X/week    PT Plan Current plan remains appropriate    Co-evaluation             End of Session Equipment Utilized During Treatment: Back brace Activity Tolerance: Patient tolerated treatment well Patient left: in chair;with call bell/phone within reach     Time: 1202-1220 PT Time Calculation (min) (ACUTE ONLY): 18 min  Charges:  $Gait Training: 8-22 mins                    G Codes:      Fredrich Birks 06/16/2015, 1:02 PM 06/16/2015 Robinette, Adline Potter PTA

## 2015-06-16 NOTE — Progress Notes (Signed)
[facility] Trauma Service  Progress Note   LOS: 3 days   Subjective: Pt doing much better.  Said he walked and sat in chair.  Less pain.  No N/V, wants solid food.  No BM yet, unsure about flatus.  Hungry/thirsty.  Friend at bedside.  Slept well.    Objective: Vital signs in last 24 hours: Temp:  [97.9 F (36.6 C)-99.8 F (37.7 C)] 97.9 F (36.6 C) (09/28 0612) Pulse Rate:  [89-111] 111 (09/28 0612) Resp:  [18-36] 21 (09/28 0612) BP: (116-149)/(55-80) 116/78 mmHg (09/28 0612) SpO2:  [96 %-97 %] 97 % (09/28 0612) Last BM Date: 06/12/15  Lab Results:  CBC  Recent Labs  06/15/15 0532  WBC 15.9*  HGB 14.3  HCT 40.4  PLT 197   BMET No results for input(s): NA, K, CL, CO2, GLUCOSE, BUN, CREATININE, CALCIUM in the last 72 hours.  Imaging: No results found.   PE: General: pleasant, WD/WN [place] male who is laying in bed in no distress HEENT: head is normocephalic, atraumatic.  Heart: regular, rate, and rhythm. Normal s1,s2. No obvious murmurs, gallops, or rubs noted. Palpable radial and pedal pulses bilaterally Lungs: CTAB, no wheezes, rhonchi, or rales noted. Respiratory effort non-labored, good effort Abd: soft, NT/ND, +BS, no masses, hernias, or organomegaly MS: all 4 extremities are symmetrical with no cyanosis, clubbing, or edema. Distal CSM intact. Skin: warm and dry with no masses, lesions, or rashes Psych: A&Ox3 with an appropriate affect.   Assessment/Plan: Fall L3 burst FX - S/P fusion L2-1 by Dr. Wynetta Emery. Brace in place, continue mobilization in brace, HOB 30* per Dr. Wynetta Emery. L 2-5 TVP FXs B pulm contusions/9th rib FX with occult PTX - F/U CXR with no PTX, pulm toilet FEN - advance to soft diet, encouraged orals for pain, wean IV, flexeril for spasm VTE - SCD's, per Dr. Wynetta Emery start Lovenox 9/28 (today).  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 He lives with  2 roommates. He is from [place] and Dr. Wynetta Emery spoke with his father 9/25.   Jorje Guild, PA-C Pager: [phone] General Trauma PA Pager: [phone]   06/16/2015

## 2015-06-17 LAB — BASIC METABOLIC PANEL
Anion gap: 11 (ref 5–15)
BUN: 10 mg/dL (ref 6–20)
CALCIUM: 9.2 mg/dL (ref 8.9–10.3)
CO2: 28 mmol/L (ref 22–32)
Chloride: 92 mmol/L — ABNORMAL LOW (ref 101–111)
Creatinine, Ser: 0.74 mg/dL (ref 0.61–1.24)
GFR calc Af Amer: >60 mL/min (ref >60)
GLUCOSE: 96 mg/dL (ref 65–99)
Potassium: 3.7 mmol/L (ref 3.5–5.1)
Sodium: 131 mmol/L — ABNORMAL LOW (ref 135–145)

## 2015-06-17 LAB — CBC
HCT: 35.9 % — ABNORMAL LOW (ref 39.0–52.0)
Hemoglobin: 12.6 g/dL — ABNORMAL LOW (ref 13.0–17.0)
MCH: 32 pg (ref 26.0–34.0)
MCHC: 35.1 g/dL (ref 30.0–36.0)
MCV: 91.1 fL (ref 78.0–100.0)
PLATELETS: 243 10*3/uL (ref 150–400)
RBC: 3.94 MIL/uL — ABNORMAL LOW (ref 4.22–5.81)
RDW: 13 % (ref 11.5–15.5)
WBC: 10.4 10*3/uL (ref 4.0–10.5)

## 2015-06-17 MED ORDER — CYCLOBENZAPRINE HCL 10 MG PO TABS: 10.0000 mg | ORAL_TABLET | Freq: Three times a day (TID) | ORAL | Status: AC | PRN

## 2015-06-17 MED ORDER — TRAMADOL HCL 50 MG PO TABS: 50.0000 mg | ORAL_TABLET | Freq: Four times a day (QID) | ORAL | Status: AC

## 2015-06-17 MED ORDER — OXYCODONE HCL 5 MG PO TABS: 5.0000 mg | ORAL_TABLET | ORAL | Status: AC | PRN

## 2015-06-17 NOTE — Discharge Planning (Signed)
Pt send home with equipment, medications, and all personal items. Education concerning prescriptions, back precautions, follow up appointments, and discharge instructions. Pt brought to front entrance by staff via wheelchair accompanied by visitor. IV removed.

## 2015-06-17 NOTE — Progress Notes (Signed)
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 S/P decompression with screw fixation of L3 on 06/13/15.    OT comments  Pt progressing towards acute OT goals. Focus of session was toilet/shower transfers, bed mobility, and AE/DME for LB ADLs. Session details below. D/c plan remains appropriate.  Follow Up Recommendations  Supervision/Assistance - 24 hour;No OT follow up    Equipment Recommendations  3 in 1 bedside comode    Recommendations for Other Services      Precautions / Restrictions Precautions Precautions: Back Required Braces or Orthoses: Spinal Brace Spinal Brace: Lumbar corset Restrictions Weight Bearing Restrictions: No       Mobility Bed Mobility Overal bed mobility: Needs Assistance Bed Mobility: Rolling;Sit to Sidelying Rolling: Min assist       Sit to sidelying: Min assist General bed mobility comments: to maintain precautions  Transfers Overall transfer level: Needs assistance Equipment used: None Transfers: Sit to/from Stand Sit to Stand: Min guard         General transfer comment: increased time and effort; min guard for safety    Balance Overall balance assessment: Needs assistance Sitting-balance support: No upper extremity supported;Feet supported Sitting balance-Leahy Scale: Good     Standing balance support: During functional activity;No upper extremity supported Standing balance-Leahy Scale: Fair                     ADL Overall ADL's : Needs assistance/impaired                         Toilet Transfer: Min guard;Ambulation;BSC           Functional mobility during ADLs: Min guard General ADL Comments: Pt able to  access feet with increased time in seated position. Completed transfer to 3n1 and bed mobility as detailed above and below. ADL education provided including AE demo and strategies. Discussed seeking handicapped parking pass from school       Vision                     Perception     Praxis      Cognition   Behavior During Therapy: [facility] for tasks assessed/performed Overall Cognitive Status: Within Functional Limits for tasks assessed                       Extremity/Trunk Assessment               Exercises     Shoulder Instructions       General Comments      Pertinent Vitals/ Pain       Pain Assessment: No/denies pain  Home Living                                          Prior Functioning/Environment              Frequency Min 2X/week     Progress Toward Goals  OT Goals(current goals can now be found in the care plan section)  Progress towards OT goals: Progressing toward goals  Acute Rehab OT Goals Patient Stated  Goal: return to classes at [employer] OT Goal Formulation: With patient Time For Goal Achievement: 06/23/15 Potential to Achieve Goals: Good ADL Goals Pt Will Perform Grooming: with modified independence;standing;with adaptive equipment Pt Will Perform Lower Body Bathing: with modified independence;with adaptive equipment;sit to/from stand Pt Will Perform Lower Body Dressing: with modified independence;with adaptive equipment;sit to/from stand Pt Will Transfer to Toilet: with modified independence;ambulating Pt Will Perform Toileting - Clothing Manipulation and hygiene: with modified independence;with adaptive equipment;sitting/lateral leans;sit to/from stand Pt Will Perform Tub/Shower Transfer: Shower transfer;with modified independence;ambulating;3 in 1 Additional ADL Goal #1: Pt will complete bed mobility at mod I level to prepare for OOB ADLs.   Plan Discharge plan remains appropriate    Co-evaluation                  End of Session Equipment Utilized During Treatment: Back brace   Activity Tolerance Patient tolerated treatment well   Patient Left in bed;with call bell/phone within reach;with family/visitor present   Nurse Communication          Time: 1610-9604 OT Time Calculation (min): 17 min  Charges: OT General Charges $OT Visit: 1 Procedure OT Treatments $Self Care/Home Management : 8-22 mins  Pilar Grammes 06/17/2015, 2:35 PM

## 2015-06-17 NOTE — Progress Notes (Signed)
Physical Therapy Treatment & Discharge Patient Details Name: Michael Lawrence MRN: [id number] DOB: 10-Jan-1996 Today's Date: 06/17/2015    History of Present Illness 19 y.o. 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 S/P decompression with screw fixation of L3 on 06/13/15.     PT Comments    Patient progressed to achieve current acute level PT goals.  Education provided on gradual activity progression as well as likely best to wait on outpatient PT until able to progress out of brace for core strength and body mechanics education.  Spoke with OT to see today to enable possible d/c today as PT goals achieved.  No further skilled acute PT needs at this time.  Follow Up Recommendations  No PT follow up     Equipment Recommendations  None recommended by PT    Recommendations for Other Services       Precautions / Restrictions Precautions Precautions: Back Required Braces or Orthoses: Spinal Brace Spinal Brace: Lumbar corset;Applied in supine position Restrictions Weight Bearing Restrictions: No    Mobility  Bed Mobility Overal bed mobility: Needs Assistance Bed Mobility: Rolling;Sit to Sidelying Rolling: Modified independent (Device/Increase time) Sidelying to sit: Modified independent (Device/Increase time)     Sit to sidelying: Modified independent (Device/Increase time) General bed mobility comments: to maintain precautions  Transfers Overall transfer level: Modified independent Equipment used: None Transfers: Sit to/from Stand Sit to Stand: Min guard         General transfer comment: increased time and effort; min guard for safety  Ambulation/Gait Ambulation/Gait assistance: Modified independent (Device/Increase time) Ambulation Distance (Feet): 200 Feet Assistive device: None Gait Pattern/deviations: Decreased stride length     General Gait Details: stiff throughout and with posterior pelvic tilt   Stairs Stairs: Yes Stairs assistance: Min assist Stair Management: Step to pattern;Forwards;One rail Left Number of Stairs: 5 General stair comments: pt anxious so held only my shouler and the opposite rail  Wheelchair Mobility    Modified Rankin (Stroke Patients Only)       Balance Overall balance assessment: Needs assistance Sitting-balance support: No upper extremity supported;Feet supported Sitting balance-Leahy Scale: Good     Standing balance support: During functional activity;No upper extremity supported Standing balance-Leahy Scale: Good                      Cognition Arousal/Alertness: Awake/alert Behavior During Therapy: WFL for tasks assessed/performed Overall Cognitive Status: Within Functional Limits for tasks assessed                      Exercises      General Comments General comments (skin integrity, edema, etc.): reviewed education on back precautions and that may be best to wait on outpatient rehab until ready to mobilize out of the brace due to limitations in mobility allowed currently as well as need for core strengthening and body mechanics education when out of brace.      Pertinent Vitals/Pain Pain Assessment: No/denies pain    Home Living                      Prior Function            PT Goals (current goals can now be found in the care plan section) Acute Rehab PT Goals Patient Stated Goal: return to classes at [employer] Progress towards PT goals: Goals met/education  completed, patient discharged from PT    Frequency  Min 5X/week    PT Plan Discharge plan needs to be updated    Co-evaluation             End of Session Equipment Utilized During Treatment: Back brace Activity Tolerance: Patient tolerated treatment well Patient left: in bed;with call bell/phone within reach;with family/visitor present     Time: 1345-1408 PT  Time Calculation (min) (ACUTE ONLY): 23 min  Charges:  $Gait Training: 8-22 mins $Self Care/Home Management: 8-22                    G Codes:      Michael Lawrence July 08, 2015, 3:58 PM  Michael Lawrence, Michael Lawrence 07/08/2015

## 2015-06-17 NOTE — Discharge Instructions (Signed)
Dr. Wynetta Emery does not want you to sit upright for any longer than 45 minutes.  He will let you know when this restriction can be lifted.  Therefore you will need to remain out of class until you are seen and cleared by him in follow up.  Spinal Fusion Spinal fusion is a procedure to make 2 or more of the bones in your spinal column (vertebrae) grow together (fuse). This procedure stops movement between the vertebrae and can relieve pain and prevent deformity.  Spinal fusion is used to treat the following conditions:  Fractures of the spine.  Herniated disk (the spongy material [cartilage] between the vertebrae).  Abnormal curvatures of the spine, such as scoliosis or kyphosis.  A weak or an unstable spine, caused by infections or tumor. RISKS AND COMPLICATIONS Complications associated with spinal fusion are rare, but they can occur. Possible complications include:  Bleeding.  Infection near the incision.  Nerve damage. Signs of nerve damage are back pain, pain in one or both legs, weakness, or numbness.  Spinal fluid leakage.  Blood clot in your leg, which can move to your lungs.  Difficulty controlling urination or bowel movements. BEFORE THE PROCEDURE  A medical evaluation will be done. This will include a physical exam, blood tests, and imaging exams.  You will talk with an anesthesiologist. This is the person who will be in charge of the anesthesia during the procedure. Spinal fusion usually requires that you are asleep during the procedure (general anesthesia).  You will need to stop taking certain medicines, particularly those associated with an increased risk of bleeding. Ask your caregiver about changing or stopping your regular medicines.  If you smoke, you will need to stop at least 2 weeks before the procedure. Smoking can slow down the healing process, especially fusion of the vertebrae, and increase the risk of complications.  Do not eat or drink anything for at least 8  hours before the procedure. PROCEDURE  A cut (incision) is made over the vertebrae that will be fused. The back muscles are separated from the vertebrae. If you are having this procedure to treat a herniated disk, the disc material pressing on the nerve root is removed (decompression). The area where the disk is removed is then filled with extra bone. Bone from another part of your body (autogenous bone) or bone from a bone donor (allograft bone) may be used. The extra bone promotes fusion between the vertebrae. Sometimes, specific medicines are added to the fusion area to promote bone healing. In most cases, screws and rods or metal plates will be used to attach the vertebrae to stabilize them while they fuse.  AFTER THE PROCEDURE   You will stay in a recovery area until the anesthesia has worn off. Your blood pressure and pulse will be checked frequently.  You will be given antibiotics to prevent infection.  You may continue to receive fluids through an intravenous (IV) tube while you are still in the hospital.  Pain after surgery is normal. You will be given pain medicine.  You will be taught how to move correctly and how to stand and walk. While in bed, you will be instructed to turn frequently, using a "log rolling" technique, in which the entire body is moved without twisting the back. Document Released: 06/03/2003 Document Revised: 11/27/2011 Document Reviewed: 11/17/2010 [facility] Patient Information 2015 [place], [place]. This information is not intended to replace advice given to you by your health care provider. Make sure you discuss any questions  you have with your health care provider.

## 2015-06-17 NOTE — Discharge Summary (Signed)
Patient ID: Michael Lawrence MRN: [id number] DOB/AGE: 06-Jul-1996 19 y.o.  Admit date: 06/12/2015 Discharge date: 06/17/2015  Procedures:  : #1 decompressive lumbar laminectomy L3-4 with complete medial facetectomy on the left and partial transpedicular decompression of the retropulsed bone fragments. #2 pedicle screw fixation L2-L4 using the globus Creo amp 5.5 pedicle screw system with 6 5 x 45 screws inserted at L2 and L4 bilaterally #3 posterior lateral arthrodesis L2-L4 using locally harvested autograft mixed with allostem morsels and strips #4 placement of a medium Hemovac drain, Dr. Wynetta Emery 06-13-15  Consults: neurosurgery, Dr. Donalee Citrin  Reason for Admission: The patient is a 19 year old male who arrives status post fall from tree. Patient states that the fall was approximately 10-12 feet. Patient landed on his back. Negative LOC. Patient underwent workup per EDP. CT scans reveal L3 lumbar fractures fracture, TP fractures of L2-5. Left ninth rib fracture. Small pneumothorax.  Admission Diagnoses:  1. Fall from tree 2. L3 burst fracture with retropulsion into the canal. 3. L2-5 TP fractures 4. Tiny pneumothorax 5. Nondisplaced ninth rib fracture  Hospital Course: The patient was admitted to the trauma service and NS was consulted.  A transpedicular decompression and fusion of L2-L4 was recommended.  He underwent this procedure later that day.  The patient tolerated this procedure well.  He got his LSO brace on POD 1.  He remained on flat on bedrest for the first day.  He was then able to get his HOB up to 30degrees and progress to PT/OT on POD 2 and 3.  He did well with therapies and outpatient PT was recommended and no OT.  A rolling walker and 3n1 were ordered at their recommendation as well.  He was mobilizing well and pain was controlled on POD 4 and he was stable for dc home.  His rib rx and transverse process fractures were stable and pain was controlled.  His small pneumothorax  resolved without further intervention.  Discharge Diagnoses:  Active Problems:   Fall from tree   Lumbar, L3, burst fracture Transverse process fractures L2-5 9th rib fracture with occult PTX Bilateral pulmonary contusions  Discharge Medications:   Medication List    TAKE these medications        cyclobenzaprine 10 MG tablet  Commonly known as:  FLEXERIL  Take 1 tablet (10 mg total) by mouth 3 (three) times daily as needed for muscle spasms.     ibuprofen 200 MG tablet  Commonly known as:  ADVIL,MOTRIN  Take 200 mg by mouth every 6 (six) hours as needed for moderate pain.     oxyCODONE 5 MG immediate release tablet  Commonly known as:  Oxy IR/ROXICODONE  Take 1-3 tablets (5-15 mg total) by mouth every 4 (four) hours as needed (  for mild pain,  for moderate pain,  for severe pain).     traMADol 50 MG tablet  Commonly known as:  ULTRAM  Take 1 tablet (50 mg total) by mouth every 6 (six) hours.        Discharge Instructions: Follow-up Information    Follow up with CRAM,GARY P, MD. Schedule an appointment as soon as possible for a visit in 1 week.   Specialty:  Neurosurgery   Why:  please arrange follow up in 1-2 weeks   Contact information:   [address] [place] [address] [phone]       Signed: Letha Cape 06/17/2015, 12:46 PM

## 2015-06-17 NOTE — Care Management Note (Signed)
Case Management Note  Patient Details  Name: Michael Lawrence MRN: [id number] Date of Birth: 1996/03/03  Subjective/Objective:    Pt for dc home today.  He will need OP physical therapy, per PT recommendations.                  Action/Plan: Referral to [facility] for OP PT.   Rehab Center will call pt for appt after dc.    Expected Discharge Date:     06/17/2015             Expected Discharge Plan:  Home w Home Health Services  In-House Referral:     Discharge planning Services  CM Consult  Post Acute Care Choice:    Choice offered to:     DME Arranged:    DME Agency:     HH Arranged:    HH Agency:     Status of Service:  Completed, signed off  Medicare Important Message Given:    Date Medicare IM Given:    Medicare IM give by:    Date Additional Medicare IM Given:    Additional Medicare Important Message give by:     If discussed at Long Length of Stay Meetings, dates discussed:    Additional Comments:  Quintella Baton, RN, BSN  Trauma/Neuro ICU Case Manager [phone]
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
## 2015-06-17 NOTE — Care Management Note (Signed)
Case Management Note  Patient Details  Name: Michael Lawrence MRN: [id number] Date of Birth: Jul 07, 1996  Subjective/Objective:     Pt needs DME, per OT recommendations.                 Action/Plan: RW and 3 in one ordered for home; referral to [facility].  DME to be delivered to pt's room prior to dc.  Expected Discharge Date:    06/17/15              Expected Discharge Plan:  Home w Home Health Services  In-House Referral:     Discharge planning Services  CM Consult  Post Acute Care Choice:    Choice offered to:     DME Arranged:  3-N-1, Dan Humphreys DME Agency:     HH Arranged:    HH Agency:     Status of Service:  Completed, signed off  Medicare Important Message Given:    Date Medicare IM Given:    Medicare IM give by:    Date Additional Medicare IM Given:    Additional Medicare Important Message give by:     If discussed at Long Length of Stay Meetings, dates discussed:    Additional Comments:  Quintella Baton, RN, BSN  Trauma/Neuro ICU Case Manager [phone]

## 2015-07-12 ENCOUNTER — Ambulatory Visit: Payer: BLUE CROSS/BLUE SHIELD | Attending: General Surgery | Admitting: Physical Therapy

## 2016-04-16 ENCOUNTER — Emergency Department (HOSPITAL_COMMUNITY)
Admission: EM | Admit: 2016-04-16 | Discharge: 2016-04-16 | Discharge status: Against Medical Advice | Payer: BLUE CROSS/BLUE SHIELD | Attending: Emergency Medicine | Admitting: Emergency Medicine

## 2016-04-16 ENCOUNTER — Encounter (HOSPITAL_COMMUNITY): Payer: Self-pay | Admitting: Emergency Medicine

## 2016-04-16 ENCOUNTER — Emergency Department (HOSPITAL_COMMUNITY): Payer: BLUE CROSS/BLUE SHIELD

## 2016-04-16 DIAGNOSIS — Y9389 Activity, other specified: Secondary | ICD-10-CM | POA: Diagnosis not present

## 2016-04-16 DIAGNOSIS — T1490XA Injury, unspecified, initial encounter: Secondary | ICD-10-CM

## 2016-04-16 DIAGNOSIS — Y929 Unspecified place or not applicable: Secondary | ICD-10-CM | POA: Insufficient documentation

## 2016-04-16 DIAGNOSIS — Y999 Unspecified external cause status: Secondary | ICD-10-CM | POA: Diagnosis not present

## 2016-04-16 DIAGNOSIS — S0993XA Unspecified injury of face, initial encounter: Secondary | ICD-10-CM | POA: Insufficient documentation

## 2016-04-16 DIAGNOSIS — F17200 Nicotine dependence, unspecified, uncomplicated: Secondary | ICD-10-CM | POA: Insufficient documentation

## 2016-04-16 DIAGNOSIS — Z79899 Other long term (current) drug therapy: Secondary | ICD-10-CM | POA: Insufficient documentation

## 2016-04-16 DIAGNOSIS — R220 Localized swelling, mass and lump, head: Secondary | ICD-10-CM

## 2016-04-16 DIAGNOSIS — R609 Edema, unspecified: Secondary | ICD-10-CM | POA: Diagnosis present
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
## 2016-04-16 NOTE — ED Notes (Signed)
Bed: WA20 Expected date:  Expected time:  Means of arrival:  Comments: 

## 2016-04-16 NOTE — ED Notes (Signed)
Pt went out to car to retrieve insurance and billing cards; pt left department and failed to return; MD notified
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
## 2017-07-30 IMAGING — CT CT L SPINE W/O CM
3 series · 16 of 33 positions shown, 19 images · IV contrast (Omni 300)
Comparison: None.

CLINICAL DATA: Low back pain after a fall [DATE] feet from a tree.
Alcohol on board.

EXAM:
CT LUMBAR SPINE WITHOUT CONTRAST
TECHNIQUE: Multidetector CT imaging of the lumbar spine was performed without
intravenous contrast administration. Multiplanar CT image
reconstructions were also generated.

[Series 10: lspine from abd/pel · axial · 0.35mm/px · z∈[-599,-379]mm · 8 of 372 slices shown, 10 images]
[im 29/372  soft-tissue]
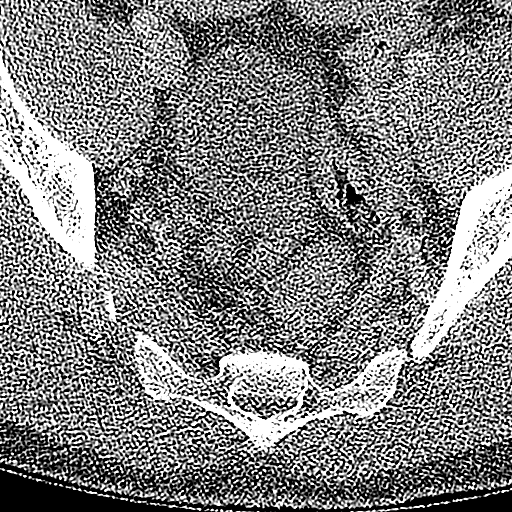
[im 29/372  bone]
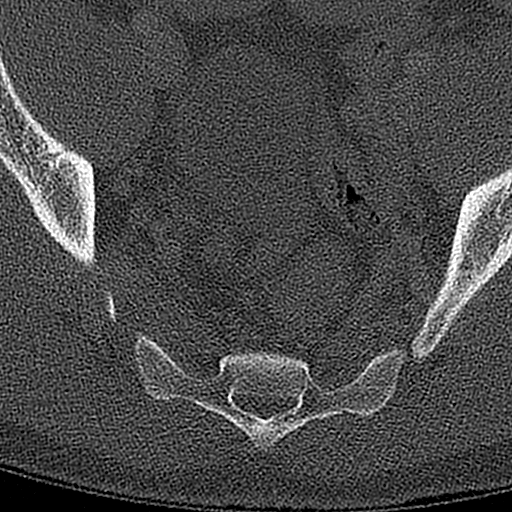
[im 86/372  bone]
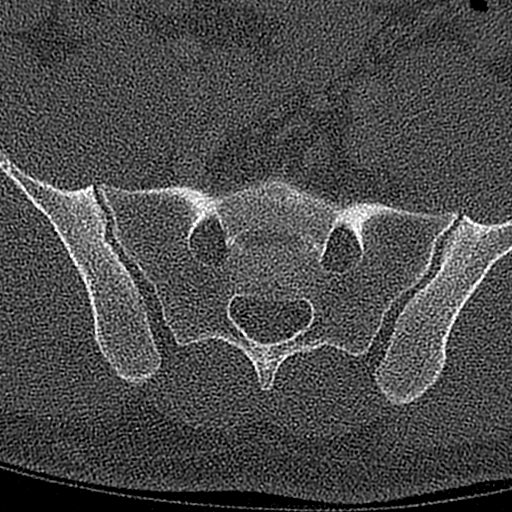
[im 115/372  bone]
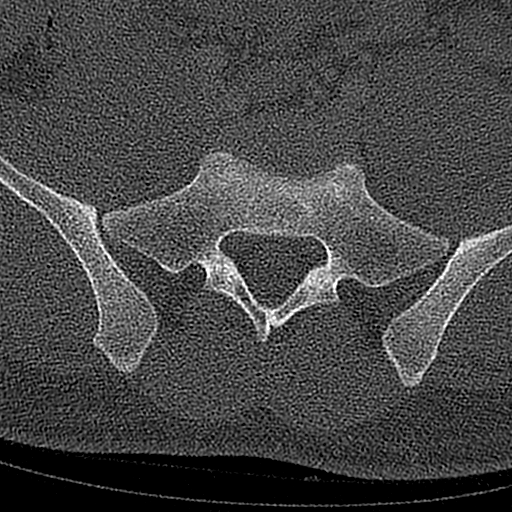
[im 172/372  bone]
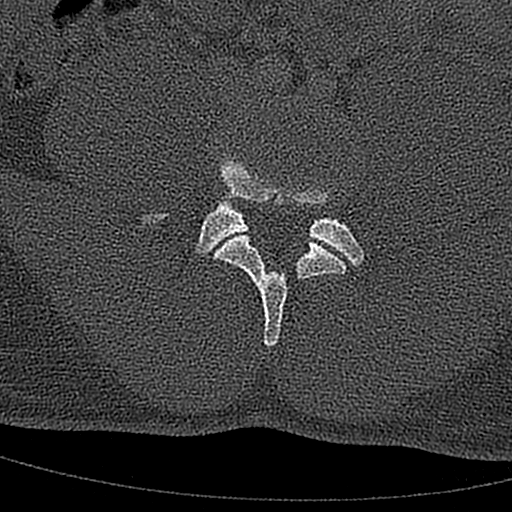
[im 200/372  soft-tissue]
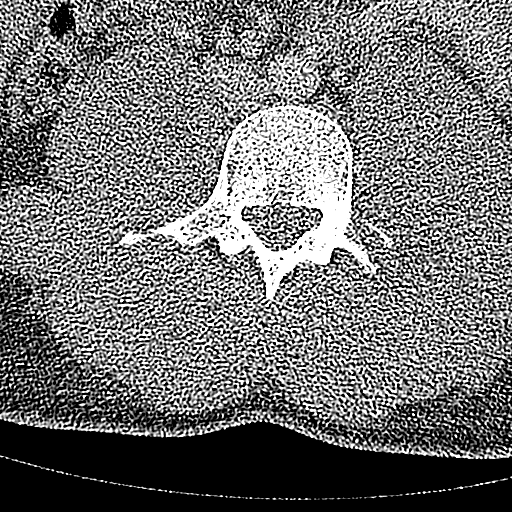
[im 200/372  bone]
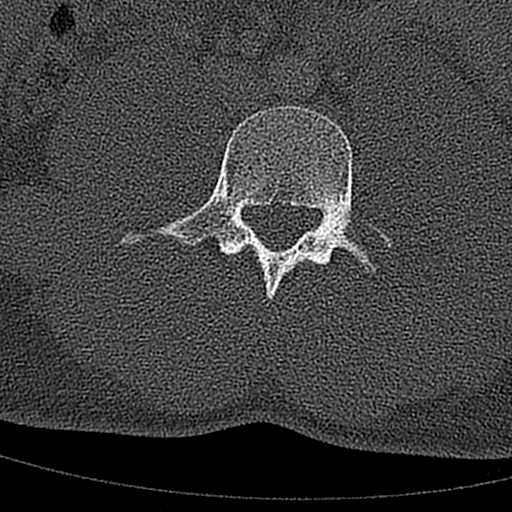
[im 257/372  bone]
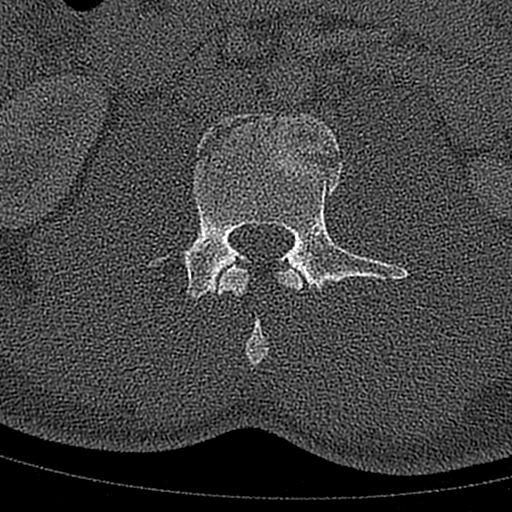
[im 286/372  bone]
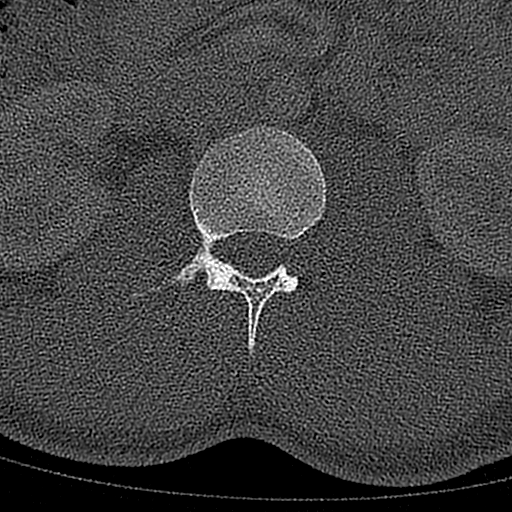
[im 343/372  bone]
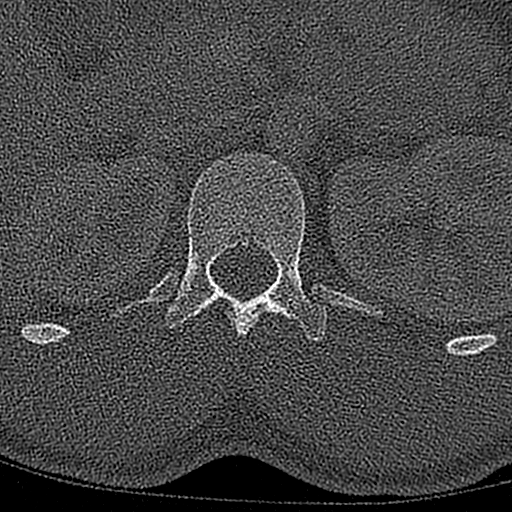

[Series 604: cor lspine · coronal · 0.51mm/px · 3 of 59 slices shown]
[im 12/59  bone]
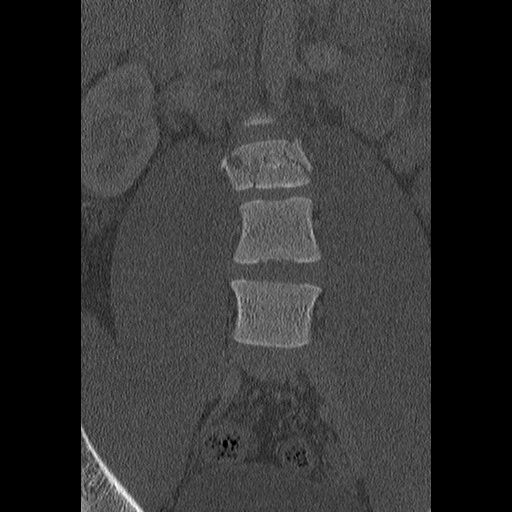
[im 24/59  bone]
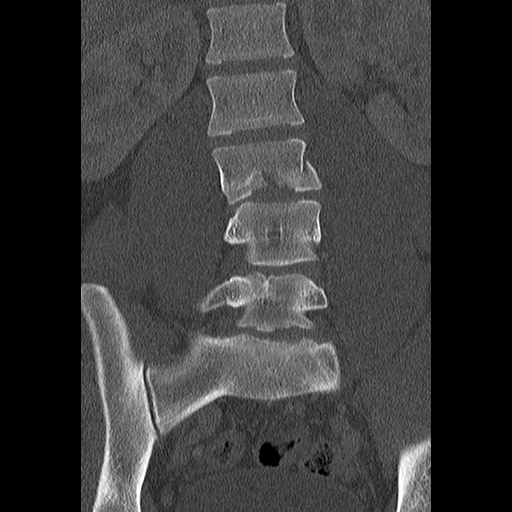
[im 35/59  bone]
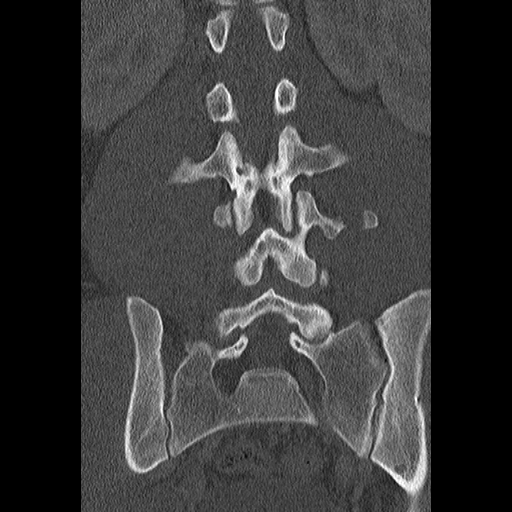

[Series 605: sag lspine · sagittal · 0.51mm/px · 5 of 62 slices shown, 6 images]
[im 21/62  bone]
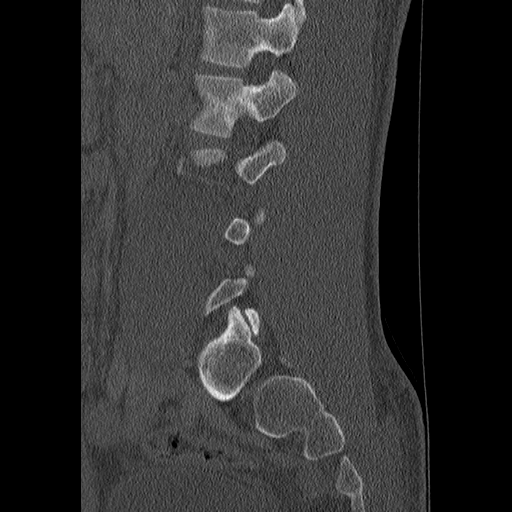
[im 26/62  bone]
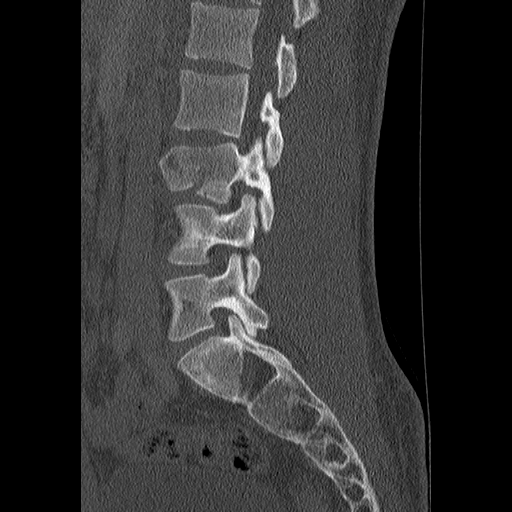
[im 31/62  soft-tissue]
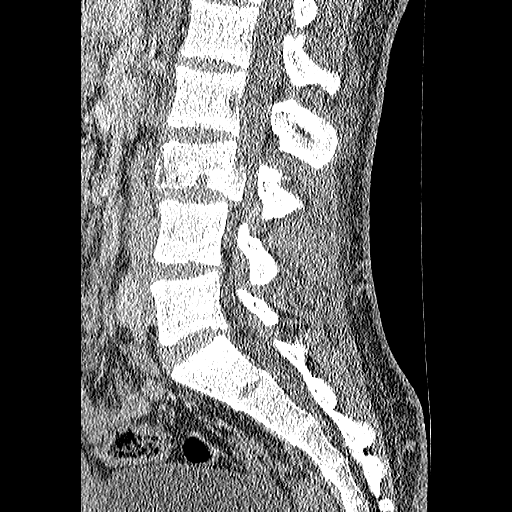
[im 31/62  bone]
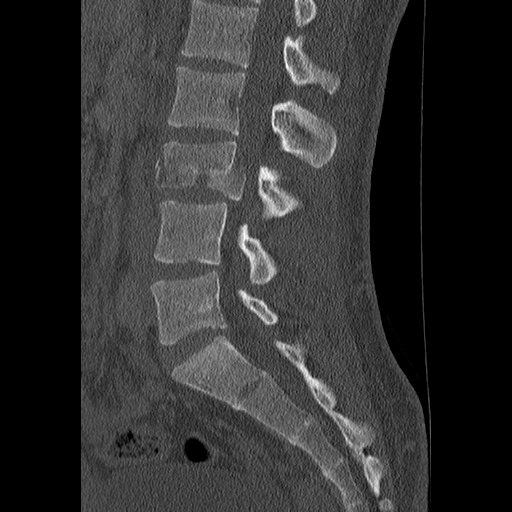
[im 36/62  bone]
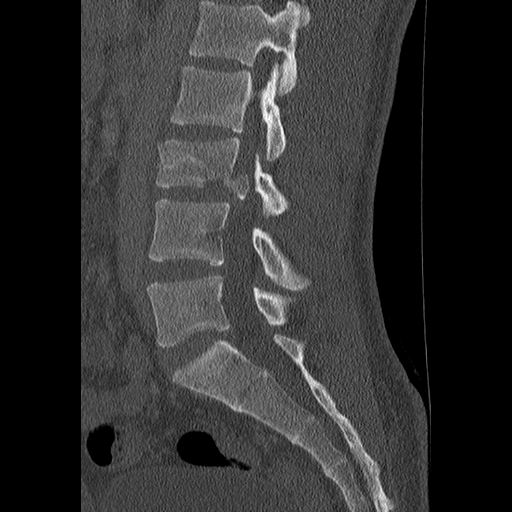
[im 41/62  bone]
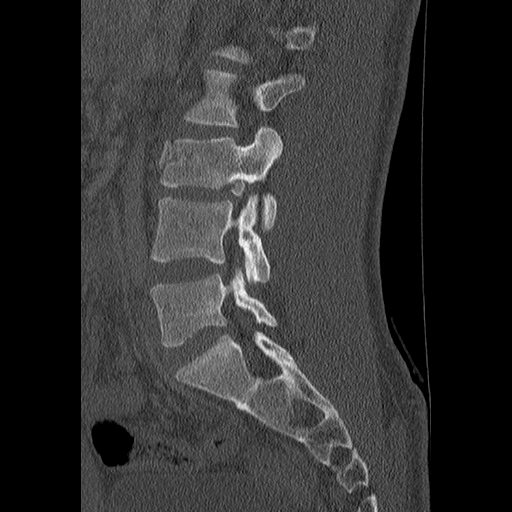

[16 of 33 positions shown; findings below may reference images not displayed]

FINDINGS: There is a burst compression fracture of the L3 vertebra with about
20% loss of height diffusely. There is retropulsion of comminuted
fracture fragments causing severe narrowing of the central canal,
which measures only about 5 mm in AP dimension. Fracture fragments
extend to cause significant effacement of the left lateral recess
and of the neural foramina bilaterally. No anterior subluxation.
Remaining lumbar vertebrae demonstrate normal alignment. No
additional compression deformities are demonstrated. Fractures of
the left transverse process of L2, L3, L4, and L5. Visualize sacrum
appears intact.
IMPRESSION: Burst compression fracture the L3 vertebra with retropulsion of
fracture fragments causing significant effacement of the central
canal and bilateral neural foramina. Fractures of the left
transverse process of L2, L3, L4, and L5.

## 2017-07-30 IMAGING — CT CT T SPINE W/O CM
3 series · 16 of 33 positions shown, 19 images · IV contrast (Omni 300)
Comparison: None.

CLINICAL DATA: Low back pain after a fall from [DATE] foot tree this
evening. No loss of consciousness. Alcohol.

EXAM:
CT THORACIC SPINE WITHOUT CONTRAST
TECHNIQUE: Multidetector CT imaging of the thoracic spine was performed without
intravenous contrast administration. Multiplanar CT image
reconstructions were also generated.

[Series 9: tspine from chest · axial · 0.34mm/px · z∈[-382,-88]mm · 8 of 498 slices shown, 10 images]
[im 39/498  soft-tissue]
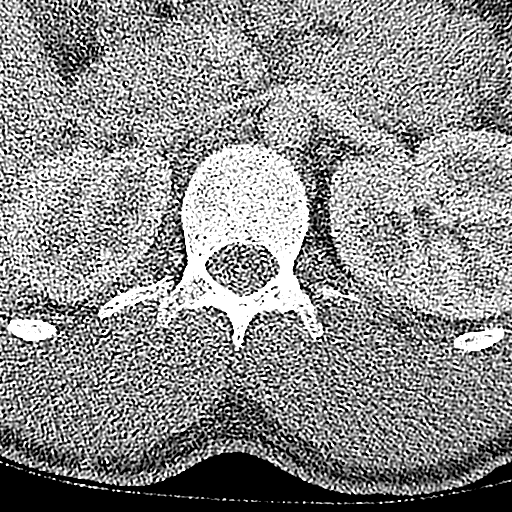
[im 39/498  bone]
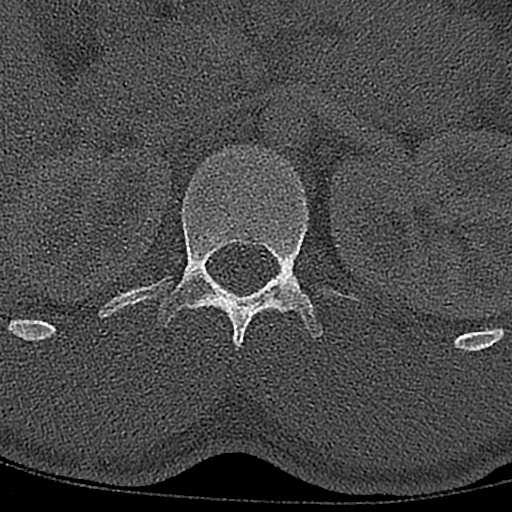
[im 115/498  bone]
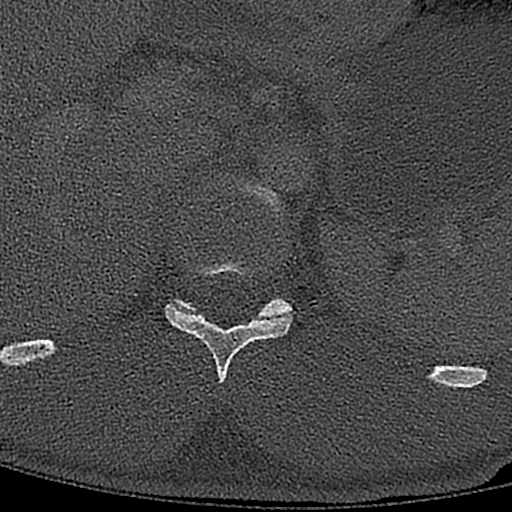
[im 153/498  bone]
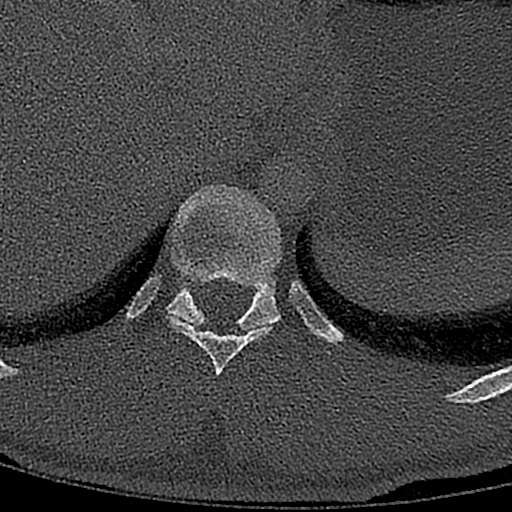
[im 230/498  bone]
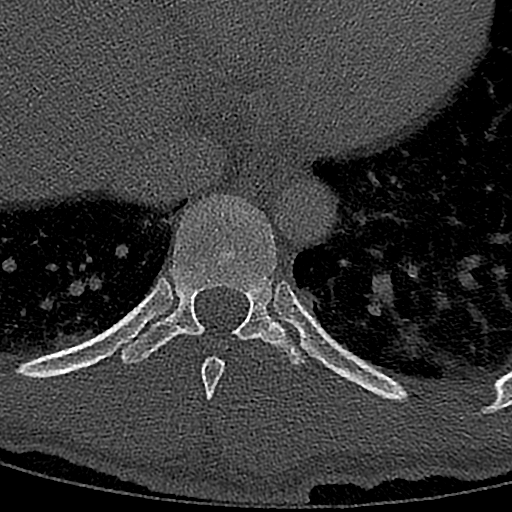
[im 268/498  soft-tissue]
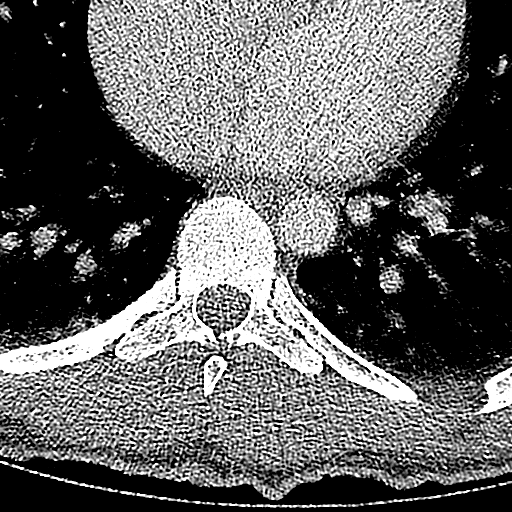
[im 268/498  bone]
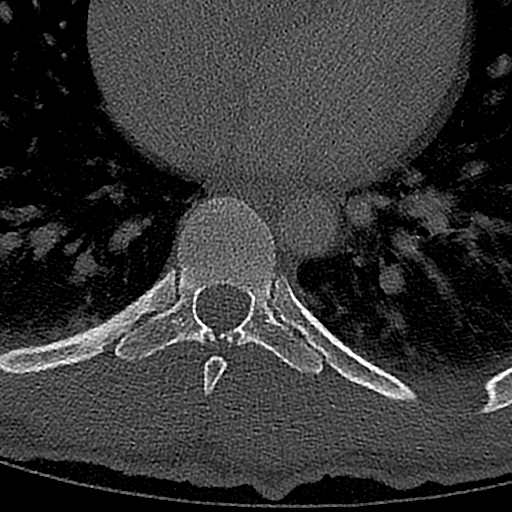
[im 345/498  bone]
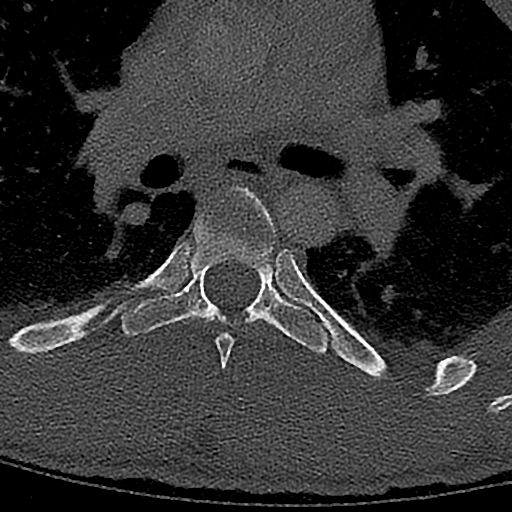
[im 383/498  bone]
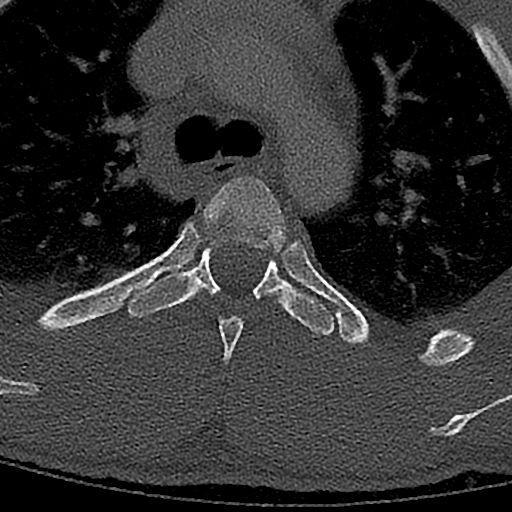
[im 459/498  bone]
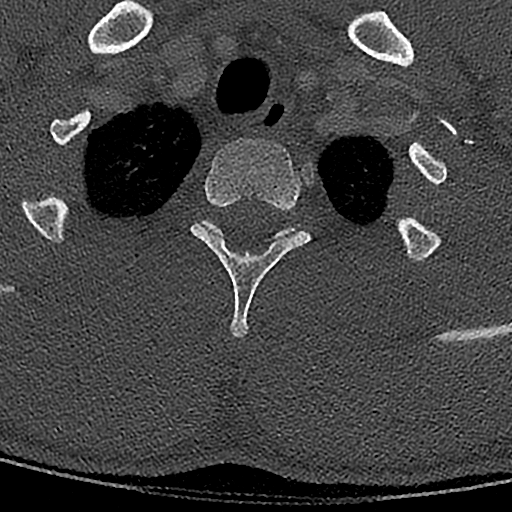

[Series 602: tspine · coronal · 0.68mm/px · 3 of 54 slices shown]
[im 11/54  bone]
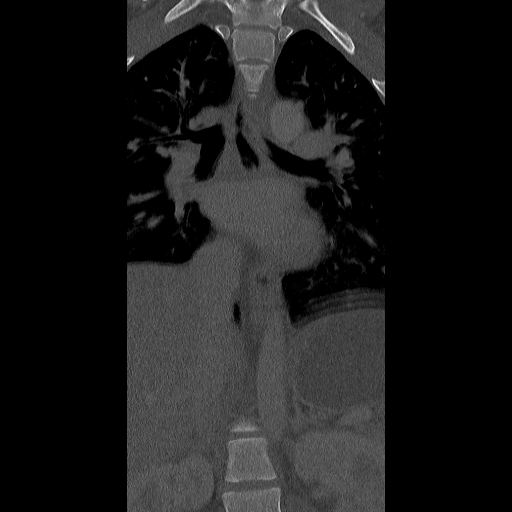
[im 22/54  bone]
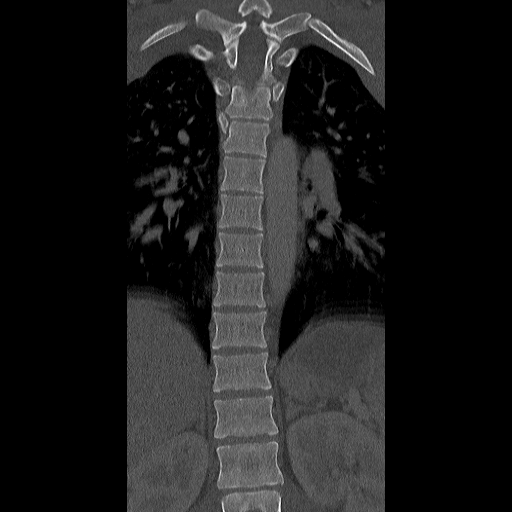
[im 32/54  bone]
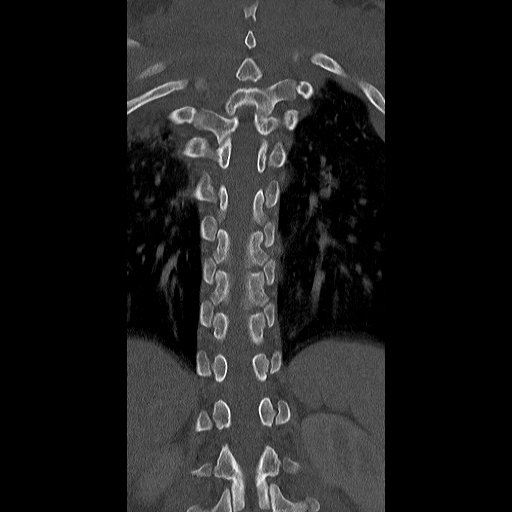

[Series 603: sag · sagittal · 0.68mm/px · 5 of 54 slices shown, 6 images]
[im 18/54  bone]
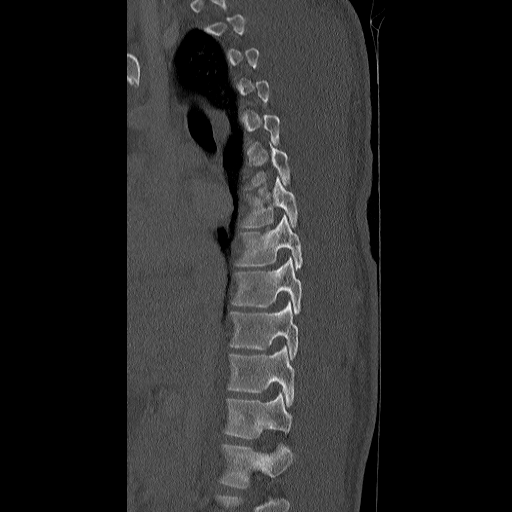
[im 23/54  bone]
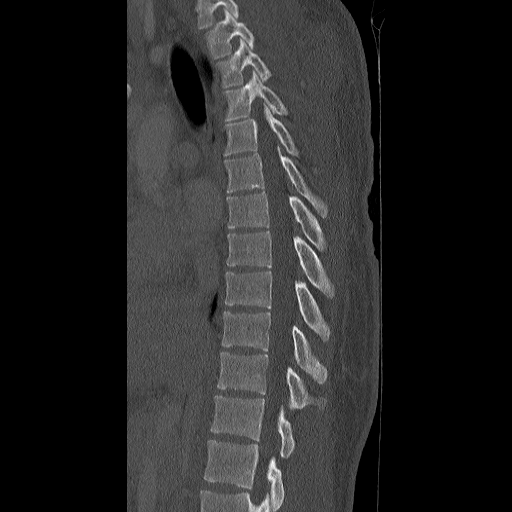
[im 27/54  soft-tissue]
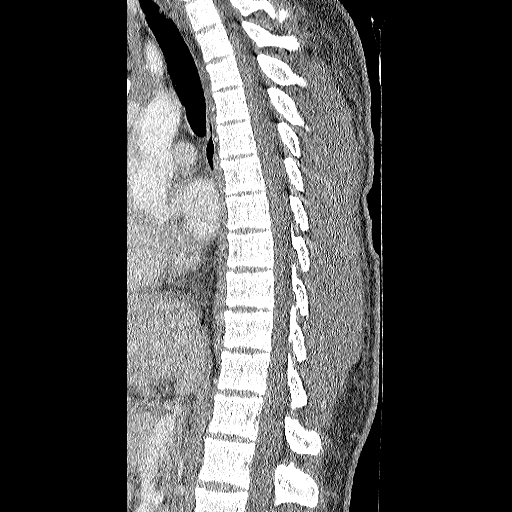
[im 27/54  bone]
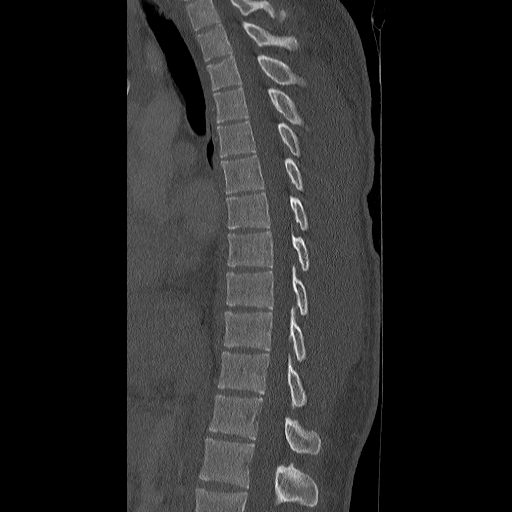
[im 31/54  bone]
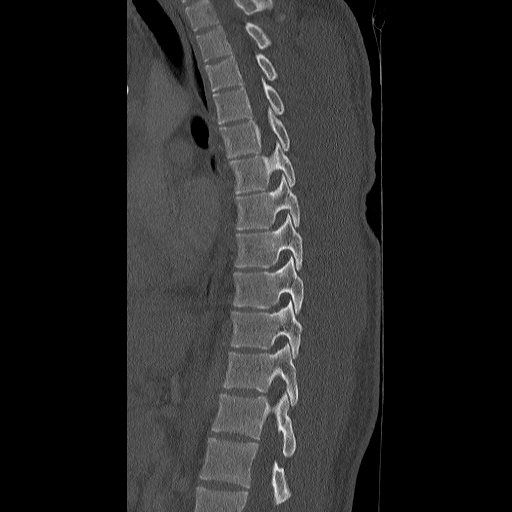
[im 36/54  bone]
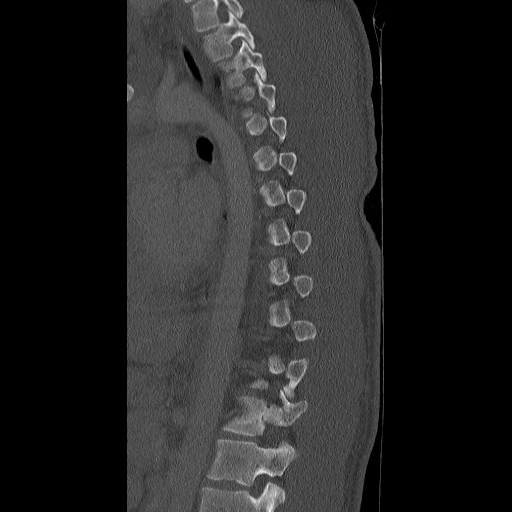

[16 of 33 positions shown; findings below may reference images not displayed]

FINDINGS: Normal alignment of the thoracic spine. No vertebral compression
deformities. Intervertebral disc space heights are preserved. No
focal bone lesion or bone destruction. Bone cortex and trabecular
architecture appears intact. Incidental note of a nondisplaced
fracture of the left posterior ninth rib. No paraspinal soft tissue
swelling or infiltration.
IMPRESSION: Normal alignment of the thoracic spine. No acute displaced fractures
identified. Nondisplaced fracture of the left posterior ninth rib.

## 2017-07-30 IMAGING — CR DG CHEST 1V PORT
1 series · 1 of 1 positions shown · non-contrast
Comparison: Chest CT 06/13/2015

CLINICAL DATA: Left pneumothorax.

EXAM:
PORTABLE CHEST 1 VIEW

[AP]
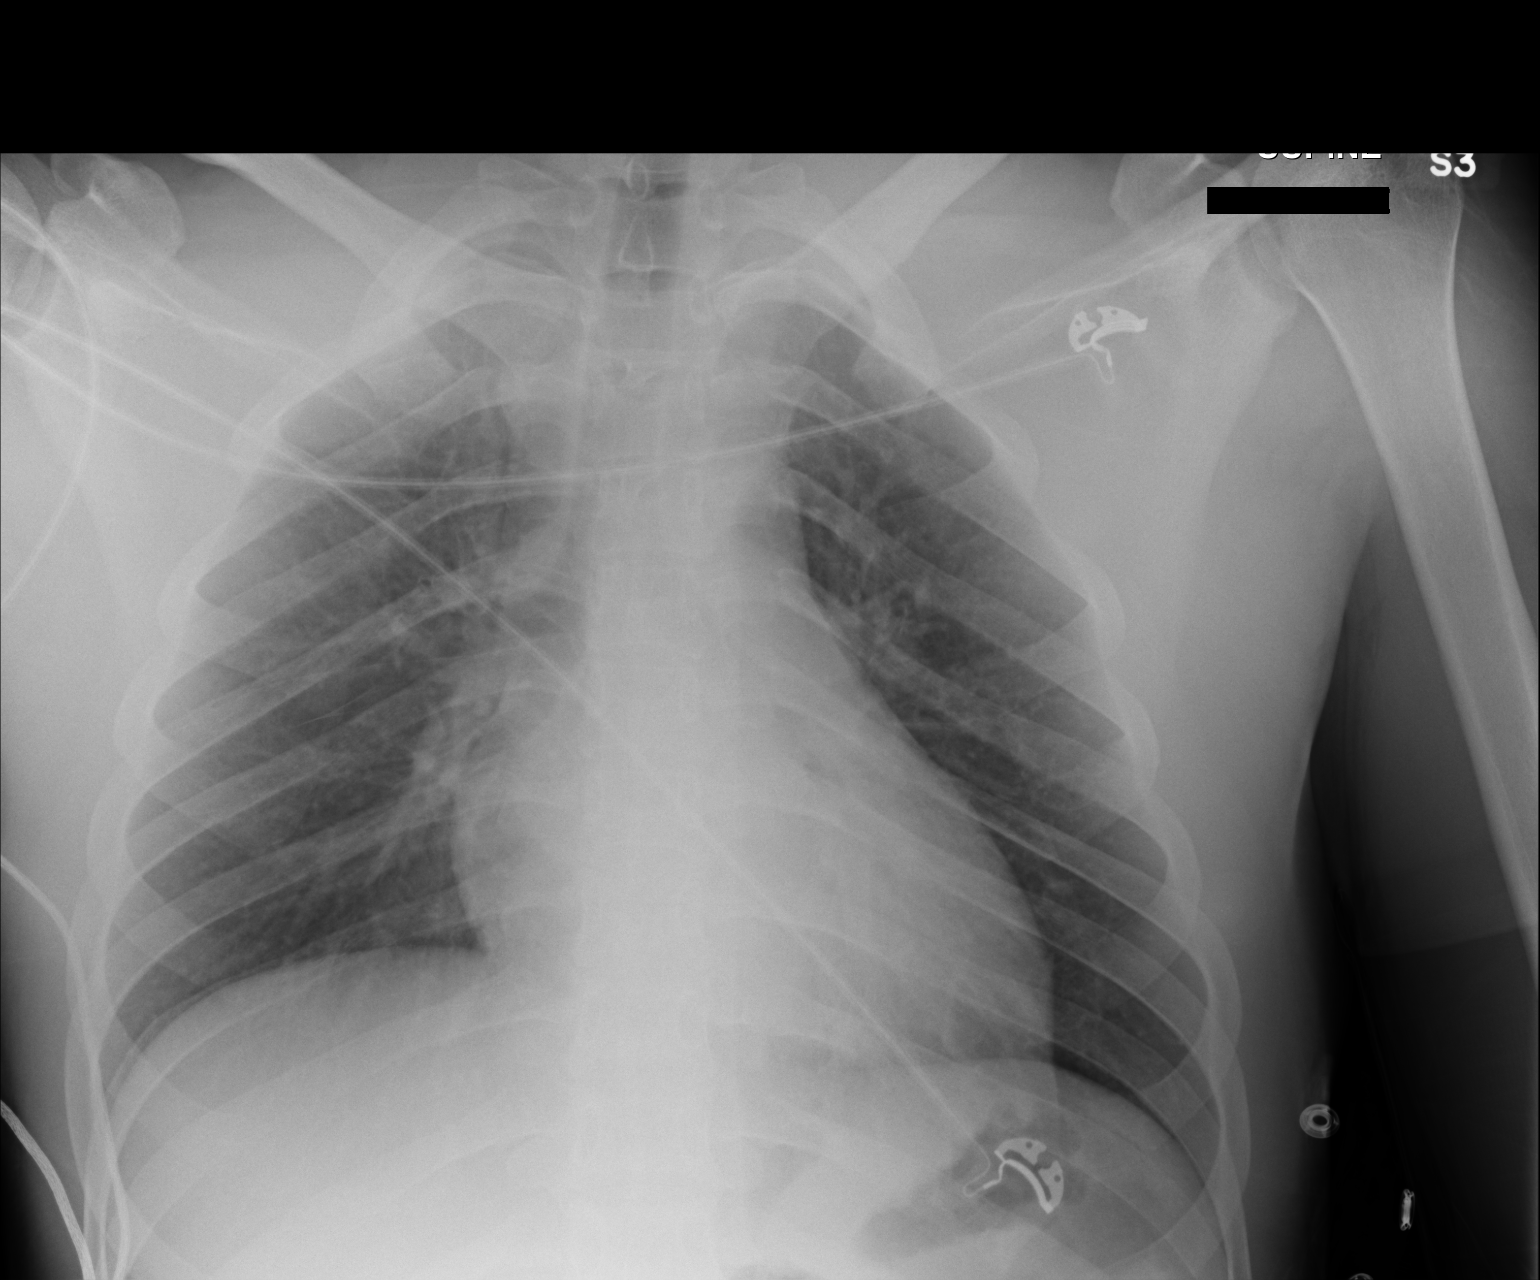

[1 of 1 positions shown; findings below may reference images not displayed]

FINDINGS: The previously seen tiny left pneumothorax by CT cannot be
appreciated by plain film. No visible pneumothorax. Lungs are clear.
Heart upper limits normal in size. No effusions or acute bony
abnormality.
IMPRESSION: No visible pneumothorax.  No acute findings.
